# Patient Record
Sex: Female | Born: 1985 | Race: Black or African American | Hispanic: No | Marital: Single | Smoking: Current every day smoker
Health system: Southern US, Community
[De-identification: ages and names within clinical notes are randomized; demographics above are authoritative.]

## PROBLEM LIST (undated history)

## (undated) DIAGNOSIS — I10 Essential (primary) hypertension: Secondary | ICD-10-CM

## (undated) HISTORY — PX: ECTOPIC PREGNANCY SURGERY: SHX613

---

## 2007-12-11 ENCOUNTER — Emergency Department: Payer: Self-pay | Admitting: Internal Medicine

## 2009-07-07 ENCOUNTER — Emergency Department: Payer: Self-pay | Admitting: Emergency Medicine

## 2009-07-13 ENCOUNTER — Emergency Department: Payer: Self-pay | Admitting: Unknown Physician Specialty

## 2009-07-15 ENCOUNTER — Emergency Department: Payer: Self-pay | Admitting: Emergency Medicine

## 2010-08-27 ENCOUNTER — Emergency Department: Payer: Self-pay | Admitting: Emergency Medicine

## 2010-08-29 ENCOUNTER — Emergency Department: Payer: Self-pay | Admitting: Emergency Medicine

## 2010-08-31 ENCOUNTER — Emergency Department: Payer: Self-pay | Admitting: Emergency Medicine

## 2010-09-02 ENCOUNTER — Emergency Department: Payer: Self-pay | Admitting: Unknown Physician Specialty

## 2010-10-21 ENCOUNTER — Emergency Department: Payer: Self-pay | Admitting: Emergency Medicine

## 2010-10-24 ENCOUNTER — Emergency Department: Payer: Self-pay | Admitting: Emergency Medicine

## 2011-05-20 ENCOUNTER — Emergency Department: Payer: Self-pay | Admitting: *Deleted

## 2011-05-25 ENCOUNTER — Emergency Department: Payer: Self-pay | Admitting: *Deleted

## 2012-12-08 ENCOUNTER — Emergency Department: Payer: Self-pay | Admitting: Internal Medicine

## 2012-12-10 ENCOUNTER — Emergency Department: Payer: Self-pay | Admitting: Emergency Medicine

## 2012-12-12 ENCOUNTER — Emergency Department: Payer: Self-pay | Admitting: Emergency Medicine

## 2013-04-23 ENCOUNTER — Emergency Department: Payer: Self-pay | Admitting: Emergency Medicine

## 2013-06-02 ENCOUNTER — Emergency Department: Payer: Self-pay | Admitting: Emergency Medicine

## 2013-06-04 ENCOUNTER — Emergency Department: Payer: Self-pay | Admitting: Emergency Medicine

## 2013-09-30 ENCOUNTER — Emergency Department: Payer: Self-pay | Admitting: Emergency Medicine

## 2013-10-01 LAB — BETA STREP CULTURE(ARMC)

## 2014-05-02 ENCOUNTER — Emergency Department: Payer: Self-pay | Admitting: Internal Medicine

## 2014-06-19 ENCOUNTER — Emergency Department: Payer: Self-pay | Admitting: Student

## 2014-06-19 LAB — CBC WITH DIFFERENTIAL/PLATELET
Basophil #: 0 10*3/uL (ref 0.0–0.1)
Basophil %: 0.6 %
EOS ABS: 0.1 10*3/uL (ref 0.0–0.7)
EOS PCT: 0.9 %
HCT: 38.7 % (ref 35.0–47.0)
HGB: 12.5 g/dL (ref 12.0–16.0)
Lymphocyte #: 1.4 10*3/uL (ref 1.0–3.6)
Lymphocyte %: 17.4 %
MCH: 29.6 pg (ref 26.0–34.0)
MCHC: 32.3 g/dL (ref 32.0–36.0)
MCV: 92 fL (ref 80–100)
MONO ABS: 0.5 x10 3/mm (ref 0.2–0.9)
MONOS PCT: 6.1 %
NEUTROS ABS: 5.9 10*3/uL (ref 1.4–6.5)
Neutrophil %: 75 %
Platelet: 273 10*3/uL (ref 150–440)
RBC: 4.23 10*6/uL (ref 3.80–5.20)
RDW: 14.8 % — ABNORMAL HIGH (ref 11.5–14.5)
WBC: 7.9 10*3/uL (ref 3.6–11.0)

## 2014-06-19 LAB — URINALYSIS, COMPLETE
Bacteria: NONE SEEN
Bilirubin,UR: NEGATIVE
GLUCOSE, UR: NEGATIVE mg/dL (ref 0–75)
Ketone: NEGATIVE
Leukocyte Esterase: NEGATIVE
Nitrite: NEGATIVE
Ph: 6 (ref 4.5–8.0)
Protein: NEGATIVE
Specific Gravity: 1.024 (ref 1.003–1.030)
WBC UR: <1 /HPF (ref 0–5)

## 2014-06-19 LAB — COMPREHENSIVE METABOLIC PANEL
ALBUMIN: 3.7 g/dL (ref 3.4–5.0)
ALK PHOS: 79 U/L
Anion Gap: 4 — ABNORMAL LOW (ref 7–16)
BUN: 6 mg/dL — AB (ref 7–18)
Bilirubin,Total: 0.4 mg/dL (ref 0.2–1.0)
CO2: 26 mmol/L (ref 21–32)
CREATININE: 0.8 mg/dL (ref 0.60–1.30)
Calcium, Total: 9.8 mg/dL (ref 8.5–10.1)
Chloride: 110 mmol/L — ABNORMAL HIGH (ref 98–107)
EGFR (African American): >60
EGFR (Non-African Amer.): >60
GLUCOSE: 106 mg/dL — AB (ref 65–99)
Osmolality: 277 (ref 275–301)
POTASSIUM: 4.1 mmol/L (ref 3.5–5.1)
SGOT(AST): 14 U/L — ABNORMAL LOW (ref 15–37)
SGPT (ALT): 21 U/L
SODIUM: 140 mmol/L (ref 136–145)
Total Protein: 7.8 g/dL (ref 6.4–8.2)

## 2014-06-19 LAB — LIPASE, BLOOD: LIPASE: 140 U/L (ref 73–393)

## 2014-09-03 ENCOUNTER — Emergency Department: Payer: Self-pay | Admitting: Emergency Medicine

## 2014-09-03 LAB — URINALYSIS, COMPLETE
BACTERIA: NONE SEEN
Bilirubin,UR: NEGATIVE
GLUCOSE, UR: NEGATIVE mg/dL (ref 0–75)
Ketone: NEGATIVE
Leukocyte Esterase: NEGATIVE
Nitrite: NEGATIVE
Ph: 7 (ref 4.5–8.0)
Protein: NEGATIVE
RBC,UR: NONE SEEN /HPF (ref 0–5)
SPECIFIC GRAVITY: 1.004 (ref 1.003–1.030)
Squamous Epithelial: <1
WBC UR: NONE SEEN /HPF (ref 0–5)

## 2014-09-03 LAB — CBC WITH DIFFERENTIAL/PLATELET
BASOS PCT: 0.8 %
Basophil #: 0.1 10*3/uL (ref 0.0–0.1)
EOS ABS: 0.1 10*3/uL (ref 0.0–0.7)
Eosinophil %: 0.9 %
HCT: 38.7 % (ref 35.0–47.0)
HGB: 12.6 g/dL (ref 12.0–16.0)
Lymphocyte #: 1.3 10*3/uL (ref 1.0–3.6)
Lymphocyte %: 14.9 %
MCH: 29.4 pg (ref 26.0–34.0)
MCHC: 32.7 g/dL (ref 32.0–36.0)
MCV: 90 fL (ref 80–100)
MONOS PCT: 5.4 %
Monocyte #: 0.5 x10 3/mm (ref 0.2–0.9)
NEUTROS ABS: 7.1 10*3/uL — AB (ref 1.4–6.5)
Neutrophil %: 78 %
PLATELETS: 254 10*3/uL (ref 150–440)
RBC: 4.3 10*6/uL (ref 3.80–5.20)
RDW: 15 % — ABNORMAL HIGH (ref 11.5–14.5)
WBC: 9 10*3/uL (ref 3.6–11.0)

## 2014-09-03 LAB — COMPREHENSIVE METABOLIC PANEL
ALK PHOS: 96 U/L
Albumin: 3.8 g/dL (ref 3.4–5.0)
Anion Gap: 6 — ABNORMAL LOW (ref 7–16)
BUN: 6 mg/dL — ABNORMAL LOW (ref 7–18)
Bilirubin,Total: 0.3 mg/dL (ref 0.2–1.0)
Calcium, Total: 9.6 mg/dL (ref 8.5–10.1)
Chloride: 107 mmol/L (ref 98–107)
Co2: 27 mmol/L (ref 21–32)
Creatinine: 0.77 mg/dL (ref 0.60–1.30)
EGFR (African American): >60
EGFR (Non-African Amer.): >60
Glucose: 104 mg/dL — ABNORMAL HIGH (ref 65–99)
OSMOLALITY: 277 (ref 275–301)
Potassium: 4.2 mmol/L (ref 3.5–5.1)
SGOT(AST): 39 U/L — ABNORMAL HIGH (ref 15–37)
SGPT (ALT): 40 U/L
SODIUM: 140 mmol/L (ref 136–145)
TOTAL PROTEIN: 8.3 g/dL — AB (ref 6.4–8.2)

## 2014-09-03 LAB — LIPASE, BLOOD: Lipase: 138 U/L (ref 73–393)

## 2014-10-18 ENCOUNTER — Ambulatory Visit: Payer: Self-pay | Admitting: Obstetrics and Gynecology

## 2014-10-18 LAB — URINALYSIS, COMPLETE
Bacteria: NONE SEEN
Bilirubin,UR: NEGATIVE
Blood: NEGATIVE
Glucose,UR: NEGATIVE mg/dL (ref 0–75)
Ketone: NEGATIVE
Leukocyte Esterase: NEGATIVE
NITRITE: NEGATIVE
Ph: 7 (ref 4.5–8.0)
Protein: NEGATIVE
Specific Gravity: 1.005 (ref 1.003–1.030)
Squamous Epithelial: <1
WBC UR: NONE SEEN /HPF (ref 0–5)

## 2014-10-18 LAB — COMPREHENSIVE METABOLIC PANEL
ALT: 29 U/L
ANION GAP: 5 — AB (ref 7–16)
AST: 27 U/L (ref 15–37)
Albumin: 3.9 g/dL (ref 3.4–5.0)
Alkaline Phosphatase: 89 U/L
BILIRUBIN TOTAL: 0.4 mg/dL (ref 0.2–1.0)
BUN: 5 mg/dL — AB (ref 7–18)
CREATININE: 0.69 mg/dL (ref 0.60–1.30)
Calcium, Total: 9.8 mg/dL (ref 8.5–10.1)
Chloride: 106 mmol/L (ref 98–107)
Co2: 27 mmol/L (ref 21–32)
EGFR (Non-African Amer.): >60
Glucose: 110 mg/dL — ABNORMAL HIGH (ref 65–99)
Osmolality: 274 (ref 275–301)
Potassium: 4.2 mmol/L (ref 3.5–5.1)
Sodium: 138 mmol/L (ref 136–145)
Total Protein: 8.5 g/dL — ABNORMAL HIGH (ref 6.4–8.2)

## 2014-10-18 LAB — CBC WITH DIFFERENTIAL/PLATELET
BASOS PCT: 0.7 %
Basophil #: 0 10*3/uL (ref 0.0–0.1)
EOS PCT: 0.9 %
Eosinophil #: 0.1 10*3/uL (ref 0.0–0.7)
HCT: 38 % (ref 35.0–47.0)
HGB: 12.3 g/dL (ref 12.0–16.0)
LYMPHS PCT: 24.3 %
Lymphocyte #: 1.6 10*3/uL (ref 1.0–3.6)
MCH: 28.9 pg (ref 26.0–34.0)
MCHC: 32.5 g/dL (ref 32.0–36.0)
MCV: 89 fL (ref 80–100)
Monocyte #: 0.3 x10 3/mm (ref 0.2–0.9)
Monocyte %: 4.8 %
Neutrophil #: 4.4 10*3/uL (ref 1.4–6.5)
Neutrophil %: 69.3 %
Platelet: 270 10*3/uL (ref 150–440)
RBC: 4.27 10*6/uL (ref 3.80–5.20)
RDW: 15.8 % — ABNORMAL HIGH (ref 11.5–14.5)
WBC: 6.4 10*3/uL (ref 3.6–11.0)

## 2014-10-18 LAB — WET PREP, GENITAL

## 2014-10-18 LAB — GC/CHLAMYDIA PROBE AMP

## 2014-10-18 LAB — HCG, QUANTITATIVE, PREGNANCY: Beta Hcg, Quant.: 8282 m[IU]/mL — ABNORMAL HIGH

## 2014-11-01 DIAGNOSIS — Z8759 Personal history of other complications of pregnancy, childbirth and the puerperium: Secondary | ICD-10-CM | POA: Insufficient documentation

## 2014-11-15 ENCOUNTER — Emergency Department: Payer: Self-pay | Admitting: Emergency Medicine

## 2015-01-28 LAB — SURGICAL PATHOLOGY

## 2015-02-03 NOTE — Op Note (Signed)
PATIENT NAME:  Sue Hall, Sue Hall MR#:  [id number] DATE OF BIRTH:  October 27, 1985  DATE OF PROCEDURE:  10/18/2014  PREOPERATIVE DIAGNOSIS: Right tubal ectopic pregnancy.  POSTOPERATIVE DIAGNOSES: 1.  Right tubal ectopic pregnancy.  2.  Pelvic adhesive disease.  3.  Evidence of Fitz-Hugh-Curtis syndrome.   PROCEDURES:  1.  Laparoscopic lysis of adhesions.  2.  Right salpingectomy.   ANESTHESIA: General endotracheal anesthesia.   SURGEON: Boykin Nearing, MD   INDICATION: A 29 year old gravida 1, para 0. Patient presented to the Emergency Department with right lower quadrant pain. The patient had a quantitative hCG of 8200 and an ultrasound that showed a tubal ectopic pregnancy with positive fetal heart motion.   FINDINGS: Significant pelvic adhesions with adhesions of the anterior cul-de-sac, adhesions of the ovaries bilaterally to the ovarian fossae, and adhesions from the bilateral fallopian tubes to the ovaries. There was evidence of perihepatic stranding consistent with Fitz-Hugh-Curtis syndrome.   PROCEDURE: After adequate general endotracheal anesthesia, the patient was placed in the dorsal supine position. The patient's legs were placed in the [place]. Abdominal, perineal, and vaginal prep was performed. Foley catheter was placed, yielding 100 mL of clear urine. Speculum was placed and the anterior cervix was grasped with a single-tooth tenaculum and a Kahn cannula was placed into the endocervical canal to be used for uterine manipulation during the procedure. Gloves were changed and a 12 mm infraumbilical incision was made after injecting with 0.5% Marcaine. The laparoscope was advanced into the abdominal cavity under direct visualization with the Optiview cannula. The patient's abdomen was insufflated with carbon dioxide. A second port was placed, left lower quadrant, approximately 2 cm medial to the left anterior iliac spine under direct visualization. A #11 trocar was  advanced into the abdominal cavity. A third port was placed, right lower quadrant; again, 2 cm medial to the right anterior iliac spine, and a 5 mm port was advanced under direct visualization.  Initial impression as above: The patient with significant pelvic adhesions due to prior infection. Anterior cul-de-sac with stranding. Stranding from the ovary to the uterus and to the right fallopian tube to the ovary as well. These adhesions were taken down with the Harmonic scalpel. A 3 cm proximal ectopic pregnancy seen in the right fallopian tube. Distal portion of fallopian tube was grasped and the Harmonic scalpel was used to clamp and transect the right mesosalpinx and the fallopian tube with the ectopic pregnancy was removed as  1 unit. Good hemostasis was noted. Several additional adhesions on the patient's left side from the ovary to the medial side of the uterus were taken down with the Harmonic scalpel. The distal portion of the left fallopian tube was free and juxtaposed to the left ovary. The ureters were identified bilaterally with normal peristaltic activity. Again, upper abdomen showed some perihepatic stranding consistent with old infection. Pressure was lowered to 7 mmHg and good hemostasis was noted from the dissected area. All instruments were removed. The patient's abdomen was deflated. The left lower quadrant incision was closed with a 2-0 Vicryl fascial layer as well as an infraumbilical fascial closure with 2-0 Vicryl, and all incisions were closed with 4-0 interrupted Vicryl. Dermabond placed and Tegaderm placed. Foley was removed as well as the single-tooth tenaculum and the Kahn cannula. Good hemostasis was noted. There were no complications.  ESTIMATED BLOOD LOSS: Minimal.  INTRAOPERATIVE FLUIDS: 1000 mL.   URINE OUTPUT: 125 mL  DISPOSITION: The patient was taken to the recovery room in good  condition.   ____________________________ Boykin Nearing, MD tjs:ST D: 10/18/2014  22:22:30 ET T: 10/19/2014 00:16:15 ET JOB#: [id number]  cc: Boykin Nearing, MD, <Dictator> Boykin Nearing MD ELECTRONICALLY SIGNED 10/22/2014 9:21

## 2015-02-03 NOTE — Consult Note (Signed)
PATIENT NAME:  Sue Hall, Sue Hall MR#:  [id number] DATE OF BIRTH:  1986/06/04  DATE OF CONSULTATION:  10/18/2014  REFERRING PHYSICIAN:   CONSULTING PHYSICIAN:  Boykin Nearing, MD  REQUESTING PHYSICIAN:  Larae Grooms, MD.    CONSULTANT:  Laverta Baltimore, MD,   HISTORY OF PRESENT ILLNESS: A 29 year old gravida 1, para 0, last menstrual period 09/22/2014, the patient found out she was pregnant the day of presenting to the Emergency Department. The patient has had right lower quadrant pain worsening today. She denies any vaginal bleeding. The patient's quantitative beta-hCG 8282. She had a pelvic ultrasound on today that showed an empty uterus and right tubal mass with a gestational sac and fetal pole with fetal heart rate. The patient last ate at 1300.   PAST MEDICAL HISTORY:  Left Bartholin gland abscess.   PAST SURGICAL HISTORY: Bartholin gland abscess drainage.   REVIEW OF SYSTEMS: Unremarkable.   ALLERGIES: No known drug allergies.   SOCIAL HISTORY: Smokes. No alcohol.   MEDICATIONS: None.    PHYSICAL EXAMINATION:  GENERAL: Well-developed, well-nourished black female, in no acute distress.  VITAL SIGNS: Blood pressure 164/81, pulse 107, and temperature 99.2.  LUNGS: Clear to auscultation.  CARDIOVASCULAR: Regular rate and rhythm.  ABDOMEN: Soft, nontender. No rebound tenderness.  PELVIC: Deferred.   ASSESSMENT: Right tubal ectopic pregnancy.   PLAN: I spoke to the patient regarding the significance of this and recommend a laparoscopic salpingostomy versus salpingectomy tonight. The patient is aware of the potential risks from surgery including organ injury to bowel, bladder, ureters, risk of blood transfusion, risk of hepatitis and HIV if blood is given. The patient agrees to the procedure. All questions have been answered.    ____________________________ Boykin Nearing, MD tjs:bu D: 10/18/2014 18:10:30 ET T: 10/18/2014 18:15:39  ET JOB#: [id number]  cc: Boykin Nearing, MD, <Dictator> Boykin Nearing MD ELECTRONICALLY SIGNED 10/22/2014 9:21

## 2015-04-01 ENCOUNTER — Emergency Department
Admission: EM | Admit: 2015-04-01 | Discharge: 2015-04-01 | Disposition: A | Discharge status: Home / Self Care | Payer: Self-pay | Attending: Student | Admitting: Student

## 2015-04-01 ENCOUNTER — Encounter: Payer: Self-pay | Admitting: *Deleted

## 2015-04-01 DIAGNOSIS — N760 Acute vaginitis: Secondary | ICD-10-CM | POA: Insufficient documentation

## 2015-04-01 DIAGNOSIS — Z720 Tobacco use: Secondary | ICD-10-CM | POA: Insufficient documentation

## 2015-04-01 DIAGNOSIS — Z3202 Encounter for pregnancy test, result negative: Secondary | ICD-10-CM | POA: Insufficient documentation

## 2015-04-01 DIAGNOSIS — B9689 Other specified bacterial agents as the cause of diseases classified elsewhere: Secondary | ICD-10-CM

## 2015-04-01 LAB — CBC WITH DIFFERENTIAL/PLATELET
Basophils Absolute: 0.1 10*3/uL (ref 0–0.1)
Basophils Relative: 1 %
Eosinophils Absolute: 0.2 10*3/uL (ref 0–0.7)
Eosinophils Relative: 2 %
HCT: 36.5 % (ref 35.0–47.0)
Hemoglobin: 12 g/dL (ref 12.0–16.0)
Lymphocytes Relative: 33 %
Lymphs Abs: 2.6 10*3/uL (ref 1.0–3.6)
MCH: 28.1 pg (ref 26.0–34.0)
MCHC: 32.9 g/dL (ref 32.0–36.0)
MCV: 85.5 fL (ref 80.0–100.0)
Monocytes Absolute: 0.5 10*3/uL (ref 0.2–0.9)
Monocytes Relative: 6 %
Neutro Abs: 4.5 10*3/uL (ref 1.4–6.5)
Neutrophils Relative %: 58 %
Platelets: 254 10*3/uL (ref 150–440)
RBC: 4.27 MIL/uL (ref 3.80–5.20)
RDW: 17.8 % — ABNORMAL HIGH (ref 11.5–14.5)
WBC: 7.8 10*3/uL (ref 3.6–11.0)

## 2015-04-01 LAB — COMPREHENSIVE METABOLIC PANEL
ALBUMIN: 4.1 g/dL (ref 3.5–5.0)
ALK PHOS: 73 U/L (ref 38–126)
ALT: 16 U/L (ref 14–54)
ANION GAP: 6 (ref 5–15)
AST: 25 U/L (ref 15–41)
BUN: 8 mg/dL (ref 6–20)
CALCIUM: 10.2 mg/dL (ref 8.9–10.3)
CO2: 27 mmol/L (ref 22–32)
CREATININE: 0.7 mg/dL (ref 0.44–1.00)
Chloride: 107 mmol/L (ref 101–111)
GFR calc Af Amer: >60 mL/min (ref >60)
GFR calc non Af Amer: >60 mL/min (ref >60)
Glucose, Bld: 89 mg/dL (ref 65–99)
POTASSIUM: 3.6 mmol/L (ref 3.5–5.1)
Sodium: 140 mmol/L (ref 135–145)
TOTAL PROTEIN: 8 g/dL (ref 6.5–8.1)
Total Bilirubin: 1 mg/dL (ref 0.3–1.2)

## 2015-04-01 LAB — LIPASE, BLOOD: Lipase: 31 U/L (ref 22–51)

## 2015-04-01 LAB — URINALYSIS COMPLETE WITH MICROSCOPIC (ARMC ONLY)
Bacteria, UA: NONE SEEN
Bilirubin Urine: NEGATIVE
Glucose, UA: NEGATIVE mg/dL
Ketones, ur: NEGATIVE mg/dL
Nitrite: NEGATIVE
Protein, ur: NEGATIVE mg/dL
Specific Gravity, Urine: 1.02 (ref 1.005–1.030)
pH: 6 (ref 5.0–8.0)

## 2015-04-01 LAB — CHLAMYDIA/NGC RT PCR (ARMC ONLY)
Chlamydia Tr: NOT DETECTED
N gonorrhoeae: NOT DETECTED

## 2015-04-01 LAB — WET PREP, GENITAL
Trich, Wet Prep: NONE SEEN
Yeast Wet Prep HPF POC: NONE SEEN

## 2015-04-01 LAB — PREGNANCY, URINE: Preg Test, Ur: NEGATIVE

## 2015-04-01 MED ORDER — METRONIDAZOLE 250 MG PO TABS
500.0000 mg | ORAL_TABLET | Freq: Once | ORAL | Status: AC
  Administered 2015-04-01: 500 mg via ORAL

## 2015-04-01 MED ORDER — METRONIDAZOLE 250 MG PO TABS
ORAL_TABLET | ORAL | Status: AC
  Administered 2015-04-01: 500 mg via ORAL
  Filled 2015-04-01: qty 2

## 2015-04-01 MED ORDER — METRONIDAZOLE 500 MG PO TABS: 500.0000 mg | ORAL_TABLET | Freq: Two times a day (BID) | ORAL | Status: AC

## 2015-04-01 MED ORDER — IBUPROFEN 800 MG PO TABS: 800.0000 mg | ORAL_TABLET | Freq: Three times a day (TID) | ORAL | Status: DC | PRN

## 2015-04-01 NOTE — ED Notes (Signed)
Past few days c/o low abd pain, some vag discharge

## 2015-04-01 NOTE — Discharge Instructions (Signed)
Bacterial Vaginosis Bacterial vaginosis is a vaginal infection that occurs when the normal balance of bacteria in the vagina is disrupted. It results from an overgrowth of certain bacteria. This is the most common vaginal infection in women of childbearing age. Treatment is important to prevent complications, especially in pregnant women, as it can cause a premature delivery. CAUSES  Bacterial vaginosis is caused by an increase in harmful bacteria that are normally present in smaller amounts in the vagina. Several different kinds of bacteria can cause bacterial vaginosis. However, the reason that the condition develops is not fully understood. RISK FACTORS Certain activities or behaviors can put you at an increased risk of developing bacterial vaginosis, including:  Having a new sex partner or multiple sex partners.  Douching.  Using an intrauterine device (IUD) for contraception. Women do not get bacterial vaginosis from toilet seats, bedding, swimming pools, or contact with objects around them. SIGNS AND SYMPTOMS  Some women with bacterial vaginosis have no signs or symptoms. Common symptoms include:  Grey vaginal discharge.  A fishlike odor with discharge, especially after sexual intercourse.  Itching or burning of the vagina and vulva.  Burning or pain with urination. DIAGNOSIS  Your health care provider will take a medical history and examine the vagina for signs of bacterial vaginosis. A sample of vaginal fluid may be taken. Your health care provider will look at this sample under a microscope to check for bacteria and abnormal cells. A vaginal pH test may also be done.  TREATMENT  Bacterial vaginosis may be treated with antibiotic medicines. These may be given in the form of a pill or a vaginal cream. A second round of antibiotics may be prescribed if the condition comes back after treatment.  HOME CARE INSTRUCTIONS   Only take over-the-counter or prescription medicines as  directed by your health care provider.  If antibiotic medicine was prescribed, take it as directed. Make sure you finish it even if you start to feel better.  Do not have sex until treatment is completed.  Tell all sexual partners that you have a vaginal infection. They should see their health care provider and be treated if they have problems, such as a mild rash or itching.  Practice safe sex by using condoms and only having one sex partner. SEEK MEDICAL CARE IF:   Your symptoms are not improving after 3 days of treatment.  You have increased discharge or pain.  You have a fever. MAKE SURE YOU:   Understand these instructions.  Will watch your condition.  Will get help right away if you are not doing well or get worse. FOR MORE INFORMATION  Centers for Disease Control and Prevention, Division of STD Prevention: www.cdc.gov/std American Sexual Health Association (ASHA): www.ashastd.org  Document Released: 09/21/2005 Document Revised: 07/12/2013 Document Reviewed: 05/03/2013 ExitCare Patient Information 2015 [employer]. This information is not intended to replace advice given to you by your health care provider. Make sure you discuss any questions you have with your health care provider.  

## 2015-04-01 NOTE — ED Provider Notes (Signed)
[facility] Emergency Department Provider Note  ____________________________________________  Time seen: 0743  I have reviewed the triage vital signs and the nursing notes.   HISTORY  Chief Complaint Abdominal Pain    HPI Sue Hall is a 29 y.o. female arrives here today with lower abdominal pain and vaginal discharge states that for the last few days she's noticed a light discharge lower abdominal pain denies any bleeding denies any bowel complaints a sexually active does not use birth control and is here today for further evaluation and treatments there is no other complaints at this time rates her overall pain and discomfort is approximately a8 out of 10 nothing making it particularly better or worse   History reviewed. No pertinent past medical history.  There are no active problems to display for this patient.   Past Surgical History  Procedure Laterality Date  . Ectopic pregnancy surgery Right     Current Outpatient Rx  Name  Route  Sig  Dispense  Refill  . ibuprofen (ADVIL,MOTRIN) 800 MG tablet   Oral   Take 1 tablet (800 mg total) by mouth every 8 (eight) hours as needed.   30 tablet   0   . metroNIDAZOLE (FLAGYL) 500 MG tablet   Oral   Take 1 tablet (500 mg total) by mouth 2 (two) times daily.   20 tablet   0     Allergies Review of patient's allergies indicates no known allergies.  No family history on file.  Social History History  Substance Use Topics  . Smoking status: Current Every Day Smoker  . Smokeless tobacco: Not on file  . Alcohol Use: No    Review of Systems Constitutional: No fever/chills Eyes: No visual changes. ENT: No sore throat. Cardiovascular: Denies chest pain. Respiratory: Denies shortness of breath. Gastrointestinal: No abdominal pain.  No nausea, no vomiting.  No diarrhea.  No constipation. Genitourinary: Negative for dysuria. Musculoskeletal: Negative for back pain. Skin: Negative for  rash. Neurological: Negative for headaches, focal weakness or numbness.  10-point ROS otherwise negative.  ____________________________________________   PHYSICAL EXAM:  VITAL SIGNS: ED Triage Vitals  Enc Vitals Group     BP 04/01/15 0709 138/89 mmHg     Pulse Rate 04/01/15 0709 94     Resp 04/01/15 0709 18     Temp 04/01/15 0709 98 F (36.7 C)     Temp Source 04/01/15 0709 Oral     SpO2 04/01/15 0709 10 %     Weight 04/01/15 0709 165 lb (74.844 kg)     Height 04/01/15 0709 5\' 3"  (1.6 m)     Head Cir --      Peak Flow --      Pain Score 04/01/15 0709 8     Pain Loc --      Pain Edu? --      Excl. in [place]? --     Constitutional: Alert and oriented. Well appearing and in no acute distress. Eyes: Conjunctivae are normal. PERRL. EOMI. Head: Atraumatic. Nose: No congestion/rhinnorhea. Mouth/Throat: Mucous membranes are moist.  Oropharynx non-erythematous. Neck: No stridor.   Cardiovascular: Normal rate, regular rhythm. Grossly normal heart sounds.  Good peripheral circulation. Respiratory: Normal respiratory effort.  No retractions. Lungs CTAB. Gastrointestinal: Soft and nontender. No distention. No abdominal bruits. No CVA tenderness. Genitourinary: External genitalia unremarkable light gray vaginal discharge no cervical motion or adnexal tenderness Musculoskeletal: No lower extremity tenderness nor edema.  No joint effusions. Neurologic:  Normal speech and language.  No gross focal neurologic deficits are appreciated. Speech is normal. No gait instability. Skin:  Skin is warm, dry and intact. No rash noted. Psychiatric: Mood and affect are normal. Speech and behavior are normal.  ____________________________________________   LABS (all labs ordered are listed, but only abnormal results are displayed)  Labs Reviewed  WET PREP, GENITAL - Abnormal; Notable for the following:    Clue Cells Wet Prep HPF POC MODERATE (*)    WBC, Wet Prep HPF POC MODERATE (*)    All other  components within normal limits  URINALYSIS COMPLETEWITH MICROSCOPIC ([facility] ONLY) - Abnormal; Notable for the following:    Color, Urine YELLOW (*)    APPearance CLEAR (*)    Hgb urine dipstick 1+ (*)    Leukocytes, UA 2+ (*)    Squamous Epithelial / LPF 0-5 (*)    All other components within normal limits  CBC WITH DIFFERENTIAL/PLATELET - Abnormal; Notable for the following:    RDW 17.8 (*)    All other components within normal limits  CHLAMYDIA/NGC RT PCR ([facility] ONLY)  WET PREP, GENITAL  CHLAMYDIA/NGC RT PCR ([facility] ONLY)  COMPREHENSIVE METABOLIC PANEL  LIPASE, BLOOD  PREGNANCY, URINE      PROCEDURES  Procedure(s) performed: None  Critical Care performed: No  ____________________________________________   INITIAL IMPRESSION / ASSESSMENT AND PLAN / ED COURSE  Pertinent labs & imaging results that were available during my care of the patient were reviewed by me and considered in my medical decision making (see chart for details).  Initial impression on this patient has bacterial vaginitis states that she has had a little bit of lower abdominal pain and discomfort with a light vaginal discharge no bleeding no fever nausea vomiting or diarrhea no bowel complaints given this with the exam and lab findings to fill comfortable starting her on Flagyl Motrin discussed with her that a gonorrhea and chlamydia swabs were sent results will be back if positive she may be followed up with she should check for herself if she does not hear anything should follow-up with crit no clinic or return here for any acute concerns or worsening symptoms ____________________________________________   FINAL CLINICAL IMPRESSION(S) / ED DIAGNOSES  Final diagnoses:  Bacterial vaginitis      Loraine Freid Verdene Rio, PA-C 04/01/15 [place], MD 04/01/15 1527

## 2015-05-07 DIAGNOSIS — D259 Leiomyoma of uterus, unspecified: Secondary | ICD-10-CM | POA: Insufficient documentation

## 2015-05-07 LAB — HM PAP SMEAR: HM Pap smear: NEGATIVE

## 2016-03-06 ENCOUNTER — Emergency Department
Admission: EM | Admit: 2016-03-06 | Discharge: 2016-03-06 | Disposition: A | Discharge status: Home / Self Care | Payer: Self-pay | Attending: Student | Admitting: Student

## 2016-03-06 ENCOUNTER — Encounter: Payer: Self-pay | Admitting: Emergency Medicine

## 2016-03-06 DIAGNOSIS — R110 Nausea: Secondary | ICD-10-CM | POA: Insufficient documentation

## 2016-03-06 DIAGNOSIS — M5442 Lumbago with sciatica, left side: Secondary | ICD-10-CM | POA: Insufficient documentation

## 2016-03-06 DIAGNOSIS — I10 Essential (primary) hypertension: Secondary | ICD-10-CM | POA: Insufficient documentation

## 2016-03-06 DIAGNOSIS — F17210 Nicotine dependence, cigarettes, uncomplicated: Secondary | ICD-10-CM | POA: Insufficient documentation

## 2016-03-06 HISTORY — DX: Essential (primary) hypertension: I10

## 2016-03-06 LAB — URINALYSIS COMPLETE WITH MICROSCOPIC (ARMC ONLY)
Bilirubin Urine: NEGATIVE
GLUCOSE, UA: NEGATIVE mg/dL
HGB URINE DIPSTICK: NEGATIVE
Ketones, ur: NEGATIVE mg/dL
Leukocytes, UA: NEGATIVE
Nitrite: NEGATIVE
PROTEIN: NEGATIVE mg/dL
Specific Gravity, Urine: 1.008 (ref 1.005–1.030)
pH: 8 (ref 5.0–8.0)

## 2016-03-06 LAB — POCT PREGNANCY, URINE: Preg Test, Ur: NEGATIVE

## 2016-03-06 MED ORDER — NAPROXEN 500 MG PO TABS
500.0000 mg | ORAL_TABLET | Freq: Two times a day (BID) | ORAL | Status: AC
Start: 2016-03-06 — End: 2017-03-06

## 2016-03-06 MED ORDER — CYCLOBENZAPRINE HCL 5 MG PO TABS: 5.0000 mg | ORAL_TABLET | Freq: Three times a day (TID) | ORAL | Status: DC | PRN

## 2016-03-06 MED ORDER — POLYETHYLENE GLYCOL 3350 17 GM/SCOOP PO POWD: ORAL | Status: DC

## 2016-03-06 NOTE — ED Notes (Signed)
Pt presents to ED with c/o left flank pain x2 weeks with nausea. Last period 02/07/16.

## 2016-03-06 NOTE — ED Provider Notes (Signed)
[facility] Emergency Department Provider Note   ____________________________________________  Time seen: Approximately 7:00 AM  I have reviewed the triage vital signs and the nursing notes.   HISTORY  Chief Complaint Flank Pain    HPI Sue Hall is a 30 y.o. female with history of hypertension who presents for evaluation of 2 weeks of intermittent left lumbar back pain radiating into the left buttocks, gradual onset, improves with changing her position/shifting her weight when on her feet, currently moderate. She denies any trauma. She denies any abdominal pain. She has had intermittent nausea but no vomiting or diarrhea. She has chronic constipation, last bowel movement was yesterday. She continues to pass flatus. She denies any numbness or weakness in the legs, no saddle anesthesia, no loss of control of bowel or bladder/incontinence, no history of malignancy or IV drug use.No dysuria or hematuria.   Past Medical History  Diagnosis Date  . Hypertension     There are no active problems to display for this patient.   Past Surgical History  Procedure Laterality Date  . Ectopic pregnancy surgery Right     Current Outpatient Rx  Name  Route  Sig  Dispense  Refill  . cyclobenzaprine (FLEXERIL) 5 MG tablet   Oral   Take 1 tablet (5 mg total) by mouth 3 (three) times daily as needed for muscle spasms. Do not drive while taking this medication.   12 tablet   0   . ibuprofen (ADVIL,MOTRIN) 800 MG tablet   Oral   Take 1 tablet (800 mg total) by mouth every 8 (eight) hours as needed.   30 tablet   0   . naproxen (NAPROSYN) 500 MG tablet   Oral   Take 1 tablet (500 mg total) by mouth 2 (two) times daily with a meal. As needed for pain.   20 tablet   0   . polyethylene glycol powder (GLYCOLAX/MIRALAX) powder      Dissolve 1 tablespoon in 4-8 ounces of juice or water and drink once daily.   255 g   0     Allergies Review of  patient's allergies indicates no known allergies.  History reviewed. No pertinent family history.  Social History Social History  Substance Use Topics  . Smoking status: Current Every Day Smoker -- 0.50 packs/day    Types: Cigarettes  . Smokeless tobacco: None  . Alcohol Use: No    Review of Systems Constitutional: No fever/chills Eyes: No visual changes. ENT: No sore throat. Cardiovascular: Denies chest pain. Respiratory: Denies shortness of breath. Gastrointestinal: No abdominal pain.  + nausea, no vomiting.  No diarrhea.  No constipation. Genitourinary: Negative for dysuria. Musculoskeletal: Positive for back pain. Skin: Negative for rash. Neurological: Negative for headaches, focal weakness or numbness.  10-point ROS otherwise negative.  ____________________________________________   PHYSICAL EXAM:  VITAL SIGNS: ED Triage Vitals  Enc Vitals Group     BP 03/06/16 0622 149/94 mmHg     Pulse Rate 03/06/16 0622 94     Resp 03/06/16 0622 16     Temp 03/06/16 0622 98.1 F (36.7 C)     Temp Source 03/06/16 0622 Oral     SpO2 03/06/16 0622 100 %     Weight 03/06/16 0622 160 lb (72.576 kg)     Height 03/06/16 0622 5\' 3"  (1.6 m)     Head Cir --      Peak Flow --      Pain Score 03/06/16 0623 8  Pain Loc --      Pain Edu? --      Excl. in [place] --     Constitutional: Alert and oriented. Well appearing and in no acute distress. Sitting up in the bedside chair, texting on her Smart phone. Eyes: Conjunctivae are normal. PERRL. EOMI. Head: Atraumatic. Nose: No congestion/rhinnorhea. Mouth/Throat: Mucous membranes are moist.  Oropharynx non-erythematous. Neck: No stridor.  No cervical spine tenderness to palpation. Cardiovascular: Normal rate, regular rhythm. Grossly normal heart sounds.  Good peripheral circulation. Respiratory: Normal respiratory effort.  No retractions. Lungs CTAB. Gastrointestinal: Soft and nontender. No distention. No CVA  tenderness. Genitourinary: deferred Musculoskeletal: No lower extremity tenderness nor edema.  No joint effusions. Mild tenderness to palpation in the left paravertebral muscles associated with the lumbar spine, no midline tenderness. 5 strength of dorsiflexion of the big toes bilaterally. 2+ DP pulse bilaterally. Neurologic:  Normal speech and language. No gross focal neurologic deficits are appreciated. No gait instability. Positive straight leg raise at 45 in the left leg. Skin:  Skin is warm, dry and intact. No rash noted. Psychiatric: Mood and affect are normal. Speech and behavior are normal.  ____________________________________________   LABS (all labs ordered are listed, but only abnormal results are displayed)  Labs Reviewed  URINALYSIS COMPLETEWITH MICROSCOPIC ([place])  POCT PREGNANCY, URINE  POC URINE PREG, ED   ____________________________________________  EKG  none ____________________________________________  RADIOLOGY  none ____________________________________________   PROCEDURES  Procedure(s) performed: None  Critical Care performed: No  ____________________________________________   INITIAL IMPRESSION / ASSESSMENT AND PLAN / ED COURSE  Pertinent labs & imaging results that were available during my care of the patient were reviewed by me and considered in my medical decision making (see chart for details).  Sue Hall is a 30 y.o. female with history of hypertension who presents for evaluation of 2 weeks of intermittent left lumbar back pain radiating into the left buttocks. On exam, she is very well-appearing and in no acute distress. Vital signs stable, she is afebrile. Family is consistent with sciatica, she has a positive straight leg raise at 13 in the left leg. No red flags concerning for cauda equina or epidural abscess. Pregnancy test is negative. Urinalysis is not consistent with infection. We'll discharge with naproxen, Flexeril,  return precautions, close PCP follow-up, she is comfortable with the discharge plan. She is also requesting a medicine for help with chronic constipation, will DC with MiraLAX. ____________________________________________   FINAL CLINICAL IMPRESSION(S) / ED DIAGNOSES  Final diagnoses:  Left-sided low back pain with left-sided sciatica      NEW MEDICATIONS STARTED DURING THIS VISIT:  New Prescriptions   CYCLOBENZAPRINE (FLEXERIL) 5 MG TABLET    Take 1 tablet (5 mg total) by mouth 3 (three) times daily as needed for muscle spasms. Do not drive while taking this medication.   NAPROXEN (NAPROSYN) 500 MG TABLET    Take 1 tablet (500 mg total) by mouth 2 (two) times daily with a meal. As needed for pain.   POLYETHYLENE GLYCOL POWDER (GLYCOLAX/MIRALAX) POWDER    Dissolve 1 tablespoon in 4-8 ounces of juice or water and drink once daily.     Note:  This document was prepared using Dragon voice recognition software and may include unintentional dictation errors.    Joanne Gavel, MD 03/06/16 [phone]

## 2016-03-08 ENCOUNTER — Emergency Department
Admission: EM | Admit: 2016-03-08 | Discharge: 2016-03-09 | Disposition: A | Discharge status: Home / Self Care | Payer: Self-pay | Attending: Emergency Medicine | Admitting: Emergency Medicine

## 2016-03-08 ENCOUNTER — Encounter: Payer: Self-pay | Admitting: Emergency Medicine

## 2016-03-08 DIAGNOSIS — D259 Leiomyoma of uterus, unspecified: Secondary | ICD-10-CM | POA: Insufficient documentation

## 2016-03-08 DIAGNOSIS — R109 Unspecified abdominal pain: Secondary | ICD-10-CM

## 2016-03-08 DIAGNOSIS — N938 Other specified abnormal uterine and vaginal bleeding: Secondary | ICD-10-CM

## 2016-03-08 DIAGNOSIS — I10 Essential (primary) hypertension: Secondary | ICD-10-CM | POA: Insufficient documentation

## 2016-03-08 DIAGNOSIS — F17210 Nicotine dependence, cigarettes, uncomplicated: Secondary | ICD-10-CM | POA: Insufficient documentation

## 2016-03-08 LAB — COMPREHENSIVE METABOLIC PANEL
ALBUMIN: 4.1 g/dL (ref 3.5–5.0)
ALK PHOS: 90 U/L (ref 38–126)
ALT: 18 U/L (ref 14–54)
AST: 23 U/L (ref 15–41)
Anion gap: 5 (ref 5–15)
BILIRUBIN TOTAL: 0.4 mg/dL (ref 0.3–1.2)
BUN: 13 mg/dL (ref 6–20)
CALCIUM: 9.9 mg/dL (ref 8.9–10.3)
CO2: 21 mmol/L — AB (ref 22–32)
CREATININE: 0.7 mg/dL (ref 0.44–1.00)
Chloride: 111 mmol/L (ref 101–111)
GFR calc non Af Amer: >60 mL/min (ref >60)
Glucose, Bld: 110 mg/dL — ABNORMAL HIGH (ref 65–99)
Potassium: 3.9 mmol/L (ref 3.5–5.1)
SODIUM: 137 mmol/L (ref 135–145)
Total Protein: 7.9 g/dL (ref 6.5–8.1)

## 2016-03-08 LAB — CBC
HCT: 35 % (ref 35.0–47.0)
Hemoglobin: 11.4 g/dL — ABNORMAL LOW (ref 12.0–16.0)
MCH: 25.7 pg — AB (ref 26.0–34.0)
MCHC: 32.7 g/dL (ref 32.0–36.0)
MCV: 78.6 fL — ABNORMAL LOW (ref 80.0–100.0)
Platelets: 260 10*3/uL (ref 150–440)
RBC: 4.45 MIL/uL (ref 3.80–5.20)
RDW: 15.4 % — ABNORMAL HIGH (ref 11.5–14.5)
WBC: 7.1 10*3/uL (ref 3.6–11.0)

## 2016-03-08 LAB — POCT PREGNANCY, URINE: PREG TEST UR: NEGATIVE

## 2016-03-08 LAB — LIPASE, BLOOD: Lipase: 35 U/L (ref 11–51)

## 2016-03-08 NOTE — ED Notes (Addendum)
Pt seen here 2 days ago with back pain, diagnosed with Sciatica; returns tonight with low midline abd pain and irregular menses (this has been for about a year); pt diagnosed with fibroids over a year ago; pt has not seen a provider since as she has no insurance; pt has not yet filled medications prescribed to her last visit

## 2016-03-09 ENCOUNTER — Emergency Department: Payer: Self-pay

## 2016-03-09 LAB — URINALYSIS COMPLETE WITH MICROSCOPIC (ARMC ONLY)
BILIRUBIN URINE: NEGATIVE
Bacteria, UA: NONE SEEN
Glucose, UA: NEGATIVE mg/dL
KETONES UR: NEGATIVE mg/dL
Leukocytes, UA: NEGATIVE
Nitrite: NEGATIVE
Protein, ur: NEGATIVE mg/dL
Specific Gravity, Urine: 1.024 (ref 1.005–1.030)
pH: 5 (ref 5.0–8.0)

## 2016-03-09 LAB — WET PREP, GENITAL
CLUE CELLS WET PREP: NONE SEEN
SPERM: NONE SEEN
TRICH WET PREP: NONE SEEN
YEAST WET PREP: NONE SEEN

## 2016-03-09 LAB — CHLAMYDIA/NGC RT PCR (ARMC ONLY)
Chlamydia Tr: NOT DETECTED
N GONORRHOEAE: NOT DETECTED

## 2016-03-09 MED ORDER — IBUPROFEN 600 MG PO TABS
600.0000 mg | ORAL_TABLET | Freq: Once | ORAL | Status: AC
  Administered 2016-03-09: 600 mg via ORAL
  Filled 2016-03-09: qty 1

## 2016-03-09 NOTE — ED Provider Notes (Signed)
[facility] Emergency Department Provider Note   ____________________________________________  Time seen: Approximately 2351 PM  I have reviewed the triage vital signs and the nursing notes.   HISTORY  Chief Complaint Abdominal Pain and Vaginal Bleeding    HPI Sue Hall is a 30 y.o. female who comes into the hospital today with some abdominal pain and vaginal bleeding. She reports that she's been passing clots. The patient started her menstrual cycle yesterday and has pain in the lower abdomen. She reports this feels worse than her typical cramps. She is having twisting in her belly. She has not taken anything for pain she has just been sleeping. The patient was diagnosed with fibroids last year but has not seen an OB/GYN since then. She reports that she wears tampons and has to change them every hour. She denies any dizziness or lightheadedness but rates her pain a 9 out of 10 in intensity. The patient was unsure what was going on so she decided to come into the hospital to be evaluated.   Past Medical History  Diagnosis Date  . Hypertension     There are no active problems to display for this patient.   Past Surgical History  Procedure Laterality Date  . Ectopic pregnancy surgery Right     Current Outpatient Rx  Name  Route  Sig  Dispense  Refill  . cyclobenzaprine (FLEXERIL) 5 MG tablet   Oral   Take 1 tablet (5 mg total) by mouth 3 (three) times daily as needed for muscle spasms. Do not drive while taking this medication.   12 tablet   0   . ibuprofen (ADVIL,MOTRIN) 800 MG tablet   Oral   Take 1 tablet (800 mg total) by mouth every 8 (eight) hours as needed.   30 tablet   0   . naproxen (NAPROSYN) 500 MG tablet   Oral   Take 1 tablet (500 mg total) by mouth 2 (two) times daily with a meal. As needed for pain.   20 tablet   0   . polyethylene glycol powder (GLYCOLAX/MIRALAX) powder      Dissolve 1 tablespoon in 4-8 ounces  of juice or water and drink once daily.   255 g   0     Allergies Review of patient's allergies indicates no known allergies.  History reviewed. No pertinent family history.  Social History Social History  Substance Use Topics  . Smoking status: Current Every Day Smoker -- 0.50 packs/day    Types: Cigarettes  . Smokeless tobacco: None  . Alcohol Use: No    Review of Systems Constitutional: No fever/chills Eyes: No visual changes. ENT: No sore throat. Cardiovascular: Denies chest pain. Respiratory: Denies shortness of breath. Gastrointestinal:  abdominal pain.  No nausea, no vomiting.  No diarrhea.  No constipation. Genitourinary:Vaginal bleeding Musculoskeletal: Negative for back pain. Skin: Negative for rash. Neurological: Negative for headaches, focal weakness or numbness.  10-point ROS otherwise negative.  ____________________________________________   PHYSICAL EXAM:  VITAL SIGNS: ED Triage Vitals  Enc Vitals Group     BP 03/08/16 2148 132/81 mmHg     Pulse Rate 03/08/16 2148 82     Resp 03/08/16 2148 18     Temp 03/08/16 2148 98.1 F (36.7 C)     Temp Source 03/08/16 2148 Oral     SpO2 03/08/16 2148 100 %     Weight 03/08/16 2148 160 lb (72.576 kg)     Height 03/08/16 2148 5\' 3"  (1.6  m)     Head Cir --      Peak Flow --      Pain Score 03/08/16 2154 10     Pain Loc --      Pain Edu? --      Excl. in [place]? --     Constitutional: Alert and oriented. Well appearing and in Mild to moderate distress. Eyes: Conjunctivae are normal. PERRL. EOMI. Head: Atraumatic. Nose: No congestion/rhinnorhea. Mouth/Throat: Mucous membranes are moist.  Oropharynx non-erythematous. Cardiovascular: Normal rate, regular rhythm. Grossly normal heart sounds.  Good peripheral circulation. Respiratory: Normal respiratory effort.  No retractions. Lungs CTAB. Gastrointestinal: Soft with mild lower abdominal tenderness to palpation. No distention. Positive bowel  sounds. Genitourinary: Normal external genitalia, minimal blood in the vaginal vault cervix unremarkable in appearance, no cervical motion tenderness, no adnexal tenderness, no uterine tenderness to palpation. Musculoskeletal: No lower extremity tenderness nor edema.   Neurologic:  Normal speech and language.  Skin:  Skin is warm, dry and intact.  Psychiatric: Mood and affect are normal.   ____________________________________________   LABS (all labs ordered are listed, but only abnormal results are displayed)  Labs Reviewed  WET PREP, GENITAL - Abnormal; Notable for the following:    WBC, Wet Prep HPF POC MODERATE (*)    All other components within normal limits  COMPREHENSIVE METABOLIC PANEL - Abnormal; Notable for the following:    CO2 21 (*)    Glucose, Bld 110 (*)    All other components within normal limits  CBC - Abnormal; Notable for the following:    Hemoglobin 11.4 (*)    MCV 78.6 (*)    MCH 25.7 (*)    RDW 15.4 (*)    All other components within normal limits  URINALYSIS COMPLETEWITH MICROSCOPIC ([facility] ONLY) - Abnormal; Notable for the following:    Color, Urine YELLOW (*)    APPearance HAZY (*)    Hgb urine dipstick 3+ (*)    Squamous Epithelial / LPF 0-5 (*)    All other components within normal limits  CHLAMYDIA/NGC RT PCR ([facility] ONLY)  LIPASE, BLOOD  POCT PREGNANCY, URINE   ____________________________________________  EKG  None ____________________________________________  RADIOLOGY  Pelvic ultrasound: Small posterior uterine fibroid and small right ovarian simple cyst ____________________________________________   PROCEDURES  Procedure(s) performed: None  Critical Care performed: No  ____________________________________________   INITIAL IMPRESSION / ASSESSMENT AND PLAN / ED COURSE  Pertinent labs & imaging results that were available during my care of the patient were reviewed by me and considered in my medical decision making  (see chart for details).  This is a 30 year old female who comes into the hospital today with some lower abdominal pain and heavy vaginal bleeding. The patient does have a history of fibroids and that is likely the cause of her symptoms. I will have the patient evaluated with an ultrasound and reassess the patient. She'll receive a dose of ibuprofen here in the emergency department.  The patient's ultrasound is unremarkable. She does have a small uterine fibroid. The patient also had minimal blood in her vault and no significant tenderness to palpation. The patient will be discharged home to follow-up with OB/GYN for further evaluation of the symptoms. ____________________________________________   FINAL CLINICAL IMPRESSION(S) / ED DIAGNOSES  Final diagnoses:  Abdominal pain  Uterine leiomyoma, unspecified location  Dysfunctional uterine bleeding      NEW MEDICATIONS STARTED DURING THIS VISIT:  New Prescriptions   No medications on file     Note:  This document was prepared using Dragon voice recognition software and may include unintentional dictation errors.    Loney Hering, MD 03/09/16 [phone]

## 2016-03-09 NOTE — ED Notes (Signed)
Patient transported to Ultrasound 

## 2016-03-09 NOTE — Discharge Instructions (Signed)
Abnormal Uterine Bleeding Abnormal uterine bleeding can affect women at various stages in life, including teenagers, women in their reproductive years, pregnant women, and women who have reached menopause. Several kinds of uterine bleeding are considered abnormal, including:  Bleeding or spotting between periods.   Bleeding after sexual intercourse.   Bleeding that is heavier or more than normal.   Periods that last longer than usual.  Bleeding after menopause.  Many cases of abnormal uterine bleeding are minor and simple to treat, while others are more serious. Any type of abnormal bleeding should be evaluated by your health care provider. Treatment will depend on the cause of the bleeding. HOME CARE INSTRUCTIONS Monitor your condition for any changes. The following actions may help to alleviate any discomfort you are experiencing:  Avoid the use of tampons and douches as directed by your health care provider.  Change your pads frequently. You should get regular pelvic exams and Pap tests. Keep all follow-up appointments for diagnostic tests as directed by your health care provider.  SEEK MEDICAL CARE IF:   Your bleeding lasts more than 1 week.   You feel dizzy at times.  SEEK IMMEDIATE MEDICAL CARE IF:   You pass out.   You are changing pads every 15 to 30 minutes.   You have abdominal pain.  You have a fever.   You become sweaty or weak.   You are passing large blood clots from the vagina.   You start to feel nauseous and vomit. MAKE SURE YOU:   Understand these instructions.  Will watch your condition.  Will get help right away if you are not doing well or get worse.   This information is not intended to replace advice given to you by your health care provider. Make sure you discuss any questions you have with your health care provider.   Document Released: 09/21/2005 Document Revised: 09/26/2013 Document Reviewed: 04/20/2013 Elsevier Interactive  Patient Education 2016 Else[employer]  Abdominal Pain, Adult Many things can cause belly (abdominal) pain. Most times, the belly pain is not dangerous. Many cases of belly pain can be watched and treated at home. HOME CARE   Do not take medicines that help you go poop (laxatives) unless told to by your doctor.  Only take medicine as told by your doctor.  Eat or drink as told by your doctor. Your doctor will tell you if you should be on a special diet. GET HELP IF:  You do not know what is causing your belly pain.  You have belly pain while you are sick to your stomach (nauseous) or have runny poop (diarrhea).  You have pain while you pee or poop.  Your belly pain wakes you up at night.  You have belly pain that gets worse or better when you eat.  You have belly pain that gets worse when you eat fatty foods.  You have a fever. GET HELP RIGHT AWAY IF:   The pain does not go away within 2 hours.  You keep throwing up (vomiting).  The pain changes and is only in the right or left part of the belly.  You have bloody or tarry looking poop. MAKE SURE YOU:   Understand these instructions.  Will watch your condition.  Will get help right away if you are not doing well or get worse.   This information is not intended to replace advice given to you by your health care provider. Make sure you discuss any questions you have with your  health care provider.   Document Released: 03/09/2008 Document Revised: 10/12/2014 Document Reviewed: 05/31/2013 Elsevier Interactive Patient Education 2016 Elsevier Inc.  Uterine Fibroids Uterine fibroids are tissue masses (tumors) that can develop in the womb (uterus). They are also called leiomyomas. This type of tumor is not cancerous (benign) and does not spread to other parts of the body outside of the pelvic area, which is between the hip bones. Occasionally, fibroids may develop in the fallopian tubes, in the cervix, or on the support  structures (ligaments) that surround the uterus. You can have one or many fibroids. Fibroids can vary in size, weight, and where they grow in the uterus. Some can become quite large. Most fibroids do not require medical treatment. CAUSES A fibroid can develop when a single uterine cell keeps growing (replicating). Most cells in the human body have a control mechanism that keeps them from replicating without control. SIGNS AND SYMPTOMS Symptoms may include:   Heavy bleeding during your period.  Bleeding or spotting between periods.  Pelvic pain and pressure.  Bladder problems, such as needing to urinate more often (urinary frequency) or urgently.  Inability to reproduce offspring (infertility).  Miscarriages. DIAGNOSIS Uterine fibroids are diagnosed through a physical exam. Your health care provider may feel the lumpy tumors during a pelvic exam. Ultrasonography and an MRI may be done to determine the size, location, and number of fibroids. TREATMENT Treatment may include:  Watchful waiting. This involves getting the fibroid checked by your health care provider to see if it grows or shrinks. Follow your health care provider's recommendations for how often to have this checked.  Hormone medicines. These can be taken by mouth or given through an intrauterine device (IUD).  Surgery.  Removing the fibroids (myomectomy) or the uterus (hysterectomy).  Removing blood supply to the fibroids (uterine artery embolization). If fibroids interfere with your fertility and you want to become pregnant, your health care provider may recommend having the fibroids removed.  HOME CARE INSTRUCTIONS  Keep all follow-up visits as directed by your health care provider. This is important.  Take medicines only as directed by your health care provider.  If you were prescribed a hormone treatment, take the hormone medicines exactly as directed.  Do not take aspirin, because it can cause bleeding.  Ask  your health care provider about taking iron pills and increasing the amount of dark green, leafy vegetables in your diet. These actions can help to boost your blood iron levels, which may be affected by heavy menstrual bleeding.  Pay close attention to your period and tell your health care provider about any changes, such as:  Increased blood flow that requires you to use more pads or tampons than usual per month.  A change in the number of days that your period lasts per month.  A change in symptoms that are associated with your period, such as abdominal cramping or back pain. SEEK MEDICAL CARE IF:  You have pelvic pain, back pain, or abdominal cramps that cannot be controlled with medicines.  You have an increase in bleeding between and during periods.  You soak tampons or pads in a half hour or less.  You feel lightheaded, extra tired, or weak. SEEK IMMEDIATE MEDICAL CARE IF:  You faint.  You have a sudden increase in pelvic pain.   This information is not intended to replace advice given to you by your health care provider. Make sure you discuss any questions you have with your health care provider.  Document Released: 09/18/2000 Document Revised: 10/12/2014 Document Reviewed: 03/20/2014 Elsevier Interactive Patient Education [employer].

## 2016-03-09 NOTE — ED Notes (Signed)
Patient transported to X-ray 

## 2016-03-09 NOTE — ED Notes (Signed)
Reviewed d/c instructions, follow-up care with pt. Pt verbalized understanding 

## 2017-02-04 ENCOUNTER — Emergency Department
Admission: EM | Admit: 2017-02-04 | Discharge: 2017-02-04 | Disposition: A | Discharge status: Home / Self Care | Payer: Self-pay | Attending: Emergency Medicine | Admitting: Emergency Medicine

## 2017-02-04 ENCOUNTER — Encounter: Payer: Self-pay | Admitting: Emergency Medicine

## 2017-02-04 DIAGNOSIS — I10 Essential (primary) hypertension: Secondary | ICD-10-CM | POA: Insufficient documentation

## 2017-02-04 DIAGNOSIS — Z79899 Other long term (current) drug therapy: Secondary | ICD-10-CM | POA: Insufficient documentation

## 2017-02-04 DIAGNOSIS — F17210 Nicotine dependence, cigarettes, uncomplicated: Secondary | ICD-10-CM | POA: Insufficient documentation

## 2017-02-04 DIAGNOSIS — J301 Allergic rhinitis due to pollen: Secondary | ICD-10-CM | POA: Insufficient documentation

## 2017-02-04 MED ORDER — LIDOCAINE 5 % EX PTCH
MEDICATED_PATCH | CUTANEOUS | Status: AC
  Filled 2017-02-04: qty 1

## 2017-02-04 MED ORDER — FLUTICASONE PROPIONATE 50 MCG/ACT NA SUSP: 2.0000 | Freq: Every day | NASAL | 0 refills | Status: DC

## 2017-02-04 MED ORDER — OXYCODONE-ACETAMINOPHEN 5-325 MG PO TABS
ORAL_TABLET | ORAL | Status: AC
  Filled 2017-02-04: qty 1

## 2017-02-04 MED ORDER — KETOROLAC TROMETHAMINE 30 MG/ML IJ SOLN
INTRAMUSCULAR | Status: AC
  Filled 2017-02-04: qty 1

## 2017-02-04 MED ORDER — LORATADINE-PSEUDOEPHEDRINE ER 5-120 MG PO TB12: 1.0000 | ORAL_TABLET | Freq: Two times a day (BID) | ORAL | 0 refills | Status: DC

## 2017-02-04 NOTE — ED Triage Notes (Signed)
Pt with sinus pressure and congestion for three days.

## 2017-02-04 NOTE — Discharge Instructions (Signed)
Your exam is consistent with sinus allergies and sinus congestion. Take the prescription meds as directed and the nasal spray as directed. Follow-up with [facility] for ongoing symptoms.

## 2017-02-04 NOTE — ED Notes (Signed)
Se triage note  States she developed some sinus pressure and congestion about 3 days ago..unsure of fever but has felt "hot"  Afebrile on arrival.

## 2017-02-07 NOTE — ED Provider Notes (Signed)
[facility] Emergency Department Provider Note ____________________________________________  Time seen: 1308  I have reviewed the triage vital signs and the nursing notes.  HISTORY  Chief Complaint  Nasal Congestion  HPI Sue Hall is a 31 y.o. female Presents to the ED with complaints of sinus pressure and congestion for the last 3 days. She is unsure of any fever but has felt "hot" over the last few days.She has not taken any medications over-the-counter for symptom relief.  Past Medical History:  Diagnosis Date  . Hypertension     There are no active problems to display for this patient.   Past Surgical History:  Procedure Laterality Date  . ECTOPIC PREGNANCY SURGERY Right     Prior to Admission medications   Medication Sig Start Date End Date Taking? Authorizing Provider  cyclobenzaprine (FLEXERIL) 5 MG tablet Take 1 tablet (5 mg total) by mouth 3 (three) times daily as needed for muscle spasms. Do not drive while taking this medication. 03/06/16   Joanne Gavel, MD  fluticasone (FLONASE) 50 MCG/ACT nasal spray Place 2 sprays into both nostrils daily. 02/04/17   Zavannah Deblois, Dannielle Karvonen, PA-C  ibuprofen (ADVIL,MOTRIN) 800 MG tablet Take 1 tablet (800 mg total) by mouth every 8 (eight) hours as needed. 04/01/15   Ruffian, III Luanna Cole, PA-C  loratadine-pseudoephedrine (CLARITIN-D 12 HOUR) 5-120 MG tablet Take 1 tablet by mouth 2 (two) times daily. 02/04/17   Alexandera Kuntzman, Dannielle Karvonen, PA-C  naproxen (NAPROSYN) 500 MG tablet Take 1 tablet (500 mg total) by mouth 2 (two) times daily with a meal. As needed for pain. 03/06/16 03/06/17  Joanne Gavel, MD  polyethylene glycol powder (GLYCOLAX/MIRALAX) powder Dissolve 1 tablespoon in 4-8 ounces of juice or water and drink once daily. 03/06/16   Joanne Gavel, MD    Allergies Patient has no known allergies.  No family history on file.  Social History Social History  Substance Use Topics  . Smoking status:  Current Every Day Smoker    Packs/day: 0.50    Types: Cigarettes  . Smokeless tobacco: Never Used  . Alcohol use No    Review of Systems  Constitutional: Negative for fever. Eyes: Negative for visual changes. ENT: Negative for sore throat. Reports sinus pressure and congestion as above. Cardiovascular: Negative for chest pain. Respiratory: Negative for shortness of breath. Neurological: Negative for headaches, focal weakness or numbness. ____________________________________________  PHYSICAL EXAM:  VITAL SIGNS: ED Triage Vitals  Enc Vitals Group     BP 02/04/17 1152 (!) 126/96     Pulse Rate 02/04/17 1152 97     Resp 02/04/17 1152 20     Temp 02/04/17 1152 98.4 F (36.9 C)     Temp Source 02/04/17 1152 Oral     SpO2 02/04/17 1152 100 %     Weight 02/04/17 1148 160 lb (72.6 kg)     Height 02/04/17 1152 5\' 3"  (1.6 m)     Head Circumference --      Peak Flow --      Pain Score 02/04/17 1148 7     Pain Loc --      Pain Edu? --      Excl. in [place]? --     Constitutional: Alert and oriented. Well appearing and in no distress. Head: Normocephalic and atraumatic. Eyes: Conjunctivae are normal. PERRL. Normal extraocular movements Ears: Canals clear. TMs intact bilaterally. Nose: No congestion/rhinorrhea/epistaxis. Mouth/Throat: Mucous membranes are moist. Uvula is midline and tonsils are flat.  Neck: Supple. No thyromegaly. Hematological/Lymphatic/Immunological: No cervical lymphadenopathy. Cardiovascular: Normal rate, regular rhythm. Normal distal pulses. Respiratory: Normal respiratory effort. No wheezes/rales/rhonchi. Gastrointestinal: Soft and nontender. No distention. ____________________________________________  INITIAL IMPRESSION / ASSESSMENT AND PLAN / ED COURSE  Patient with symptoms consistent with a likely allergic rhinitis due to pollen. She will be discharged with prescriptions for Flonase and Claritin-D to dose as directed. She will follow up with one of the  local commuter clinics for ongoing symptom management. Workup for when she provided as requested. ____________________________________________  FINAL CLINICAL IMPRESSION(S) / ED DIAGNOSES  Final diagnoses:  Allergic rhinitis due to pollen, unspecified seasonality      Carmie End, Dannielle Karvonen, PA-C 02/07/17 0128    Eula Listen, MD 02/08/17 [phone]

## 2017-07-19 ENCOUNTER — Emergency Department
Admission: EM | Admit: 2017-07-19 | Discharge: 2017-07-19 | Disposition: A | Discharge status: Home / Self Care | Payer: Self-pay | Attending: Emergency Medicine | Admitting: Emergency Medicine

## 2017-07-19 DIAGNOSIS — R109 Unspecified abdominal pain: Secondary | ICD-10-CM | POA: Insufficient documentation

## 2017-07-19 DIAGNOSIS — F17210 Nicotine dependence, cigarettes, uncomplicated: Secondary | ICD-10-CM | POA: Insufficient documentation

## 2017-07-19 DIAGNOSIS — B349 Viral infection, unspecified: Secondary | ICD-10-CM | POA: Insufficient documentation

## 2017-07-19 DIAGNOSIS — Z79899 Other long term (current) drug therapy: Secondary | ICD-10-CM | POA: Insufficient documentation

## 2017-07-19 DIAGNOSIS — N912 Amenorrhea, unspecified: Secondary | ICD-10-CM | POA: Insufficient documentation

## 2017-07-19 DIAGNOSIS — I10 Essential (primary) hypertension: Secondary | ICD-10-CM | POA: Insufficient documentation

## 2017-07-19 LAB — CBC
HCT: 34.9 % — ABNORMAL LOW (ref 35.0–47.0)
Hemoglobin: 11 g/dL — ABNORMAL LOW (ref 12.0–16.0)
MCH: 23.2 pg — ABNORMAL LOW (ref 26.0–34.0)
MCHC: 31.5 g/dL — ABNORMAL LOW (ref 32.0–36.0)
MCV: 73.8 fL — ABNORMAL LOW (ref 80.0–100.0)
Platelets: 322 K/uL (ref 150–440)
RBC: 4.74 MIL/uL (ref 3.80–5.20)
RDW: 17.2 % — ABNORMAL HIGH (ref 11.5–14.5)
WBC: 8.3 K/uL (ref 3.6–11.0)

## 2017-07-19 LAB — URINALYSIS, COMPLETE (UACMP) WITH MICROSCOPIC
Bacteria, UA: NONE SEEN
Bilirubin Urine: NEGATIVE
Glucose, UA: NEGATIVE mg/dL
Ketones, ur: NEGATIVE mg/dL
Leukocytes, UA: NEGATIVE
Nitrite: NEGATIVE
Protein, ur: NEGATIVE mg/dL
RBC / HPF: NONE SEEN RBC/hpf (ref 0–5)
Specific Gravity, Urine: 1.016 (ref 1.005–1.030)
Squamous Epithelial / HPF: NONE SEEN
pH: 5 (ref 5.0–8.0)

## 2017-07-19 LAB — COMPREHENSIVE METABOLIC PANEL WITH GFR
ALT: 22 U/L (ref 14–54)
AST: 28 U/L (ref 15–41)
Albumin: 4.1 g/dL (ref 3.5–5.0)
Alkaline Phosphatase: 75 U/L (ref 38–126)
Anion gap: 7 (ref 5–15)
BUN: 7 mg/dL (ref 6–20)
CO2: 24 mmol/L (ref 22–32)
Calcium: 10.2 mg/dL (ref 8.9–10.3)
Chloride: 104 mmol/L (ref 101–111)
Creatinine, Ser: 0.85 mg/dL (ref 0.44–1.00)
GFR calc Af Amer: >60 mL/min
GFR calc non Af Amer: >60 mL/min
Glucose, Bld: 134 mg/dL — ABNORMAL HIGH (ref 65–99)
Potassium: 3.3 mmol/L — ABNORMAL LOW (ref 3.5–5.1)
Sodium: 135 mmol/L (ref 135–145)
Total Bilirubin: 1 mg/dL (ref 0.3–1.2)
Total Protein: 8.5 g/dL — ABNORMAL HIGH (ref 6.5–8.1)

## 2017-07-19 LAB — LIPASE, BLOOD: Lipase: 27 U/L (ref 11–51)

## 2017-07-19 LAB — POCT PREGNANCY, URINE: Preg Test, Ur: NEGATIVE

## 2017-07-19 MED ORDER — ONDANSETRON HCL 4 MG PO TABS: 4.0000 mg | ORAL_TABLET | Freq: Every day | ORAL | 0 refills | Status: DC | PRN

## 2017-07-19 MED ORDER — ONDANSETRON 4 MG PO TBDP
4.0000 mg | ORAL_TABLET | Freq: Once | ORAL | Status: AC
  Administered 2017-07-19: 4 mg via ORAL
  Filled 2017-07-19: qty 1

## 2017-07-19 NOTE — ED Provider Notes (Signed)
[facility] Emergency Department Provider Note  ____________________________________________   First MD Initiated Contact with Patient 07/19/17 1513     (approximate)  I have reviewed the triage vital signs and the nursing notes.   HISTORY  Chief Complaint Nausea and Dizziness   HPI Sue Hall is a 31 y.o. female with a history of ectopic pregnancy was presenting to the emergency department today with rhinorrhea, intermittent abdominal pain which is positional as well as nausea over the past week. She is not reporting fever. Denies any known sick contacts. Says that she was concerned about passing out at work. She says that she is also not had her period since August which is abnormal for her. She says that she usually has a regular period every month. She is denying any vaginal bleeding or discharge. Says that she is also not sleeping normally for her and only is getting several hours of sleep per night.denies any abdominal pain at this time.   Past Medical History:  Diagnosis Date  . Hypertension     There are no active problems to display for this patient.   Past Surgical History:  Procedure Laterality Date  . ECTOPIC PREGNANCY SURGERY Right     Prior to Admission medications   Medication Sig Start Date End Date Taking? Authorizing Provider  cyclobenzaprine (FLEXERIL) 5 MG tablet Take 1 tablet (5 mg total) by mouth 3 (three) times daily as needed for muscle spasms. Do not drive while taking this medication. 03/06/16   Joanne Gavel, MD  fluticasone (FLONASE) 50 MCG/ACT nasal spray Place 2 sprays into both nostrils daily. 02/04/17   Menshew, Dannielle Karvonen, PA-C  ibuprofen (ADVIL,MOTRIN) 800 MG tablet Take 1 tablet (800 mg total) by mouth every 8 (eight) hours as needed. 04/01/15   Ruffian, III Luanna Cole, PA-C  loratadine-pseudoephedrine (CLARITIN-D 12 HOUR) 5-120 MG tablet Take 1 tablet by mouth 2 (two) times daily. 02/04/17   Menshew, Dannielle Karvonen, PA-C  polyethylene glycol powder (GLYCOLAX/MIRALAX) powder Dissolve 1 tablespoon in 4-8 ounces of juice or water and drink once daily. 03/06/16   Joanne Gavel, MD    Allergies Patient has no known allergies.  No family history on file.  Social History Social History  Substance Use Topics  . Smoking status: Current Every Day Smoker    Packs/day: 0.50    Types: Cigarettes  . Smokeless tobacco: Never Used  . Alcohol use No    Review of Systems  Constitutional: No fever/chills Eyes: No visual changes. ENT: No sore throat. Cardiovascular: Denies chest pain. Respiratory: Denies shortness of breath. Gastrointestinal: No abdominal pain.  vomiting.  No diarrhea.  No constipation. Genitourinary: Negative for dysuria. Musculoskeletal: Negative for back pain. Skin: Negative for rash. Neurological: Negative for headaches, focal weakness or numbness.   ____________________________________________   PHYSICAL EXAM:  VITAL SIGNS: ED Triage Vitals [07/19/17 1457]  Enc Vitals Group     BP (!) 175/93     Pulse Rate (!) 108     Resp 18     Temp 98.9 F (37.2 C)     Temp Source Oral     SpO2 98 %     Weight 155 lb (70.3 kg)     Height 5\' 3"  (1.6 m)     Head Circumference      Peak Flow      Pain Score 7     Pain Loc      Pain Edu?  Excl. in [place]?     Constitutional: Alert and oriented. Well appearing and in no acute distress. Eyes: Conjunctivae are normal.  Head: Atraumatic.normal TMs bilaterally with a mild to moderate amount of cerumen to the bilateral external canals. Nose: rhinorrhea to the bilateral nares. Mouth/Throat: Mucous membranes are moist.  Neck: No stridor.   Cardiovascular: Normal rate, regular rhythm. Grossly normal heart sounds.  Respiratory: Normal respiratory effort.  No retractions. Lungs CTAB. Gastrointestinal: Soft and nontender. No distention. Musculoskeletal: No lower extremity tenderness nor edema.  No joint effusions. Neurologic:   Normal speech and language. No gross focal neurologic deficits are appreciated. Skin:  Skin is warm, dry and intact. No rash noted. Psychiatric: Mood and affect are normal. Speech and behavior are normal.  ____________________________________________   LABS (all labs ordered are listed, but only abnormal results are displayed)  Labs Reviewed  COMPREHENSIVE METABOLIC PANEL - Abnormal; Notable for the following:       Result Value   Potassium 3.3 (*)    Glucose, Bld 134 (*)    Total Protein 8.5 (*)    All other components within normal limits  CBC - Abnormal; Notable for the following:    Hemoglobin 11.0 (*)    HCT 34.9 (*)    MCV 73.8 (*)    MCH 23.2 (*)    MCHC 31.5 (*)    RDW 17.2 (*)    All other components within normal limits  URINALYSIS, COMPLETE (UACMP) WITH MICROSCOPIC - Abnormal; Notable for the following:    Color, Urine YELLOW (*)    APPearance CLEAR (*)    Hgb urine dipstick SMALL (*)    All other components within normal limits  LIPASE, BLOOD  POC URINE PREG, ED  POCT PREGNANCY, URINE   ____________________________________________  EKG  ED ECG REPORT I, Kihanna Kamiya,  Youlanda Roys, the attending physician, personally viewed and interpreted this ECG.   Date: 07/19/2017  EKG Time: 1503  Rate: 105  Rhythm: sinus tachycardia  Axis: normal  Intervals:none  ST&T Change: no ST segment elevation or depression. No abnormal T-wave inversion.  ____________________________________________  RADIOLOGY   ____________________________________________   PROCEDURES  Procedure(s) performed:   Procedures  Critical Care performed:   ____________________________________________   INITIAL IMPRESSION / ASSESSMENT AND PLAN / ED COURSE  Pertinent labs & imaging results that were available during my care of the patient were reviewed by me and considered in my medical decision making (see chart for details).   DDX: Nausea, viral syndrome, dehydration, UTI, uterine  fibroids, amenorrhea, pregnancy  As part of my medical decision making, I reviewed the following data within the [facility] Notes from prior ED visits.  Also reviewed the patient's ultrasound from 2017 which showed a small fibroid about 2.5 cm at its largest dimension.   ----------------------------------------- 4:33 PM on 07/19/2017 -----------------------------------------  Patient with very reassuring lab work. Says that she feels much better with Zofran. Continues to be abdominal pain-free. Likely viral syndrome. Patient to follow up with her OB/GYN for amenorrhea. Possible stress induced from lack of sleep. She is understanding of the diagnosis as well as the plan and willing to comply.     ____________________________________________   FINAL CLINICAL IMPRESSION(S) / ED DIAGNOSES  viral syndrome. Amenorrhea.    NEW MEDICATIONS STARTED DURING THIS VISIT:  New Prescriptions   No medications on file     Note:  This document was prepared using Dragon voice recognition software and may include unintentional dictation errors.  Orbie Pyo, MD 07/19/17 [phone]

## 2017-07-19 NOTE — ED Triage Notes (Signed)
Abdominal pain, nausea, lightheaded X 2 days. Pt alert and oriented X4, active, cooperative, pt in NAD. RR even and unlabored, color WNL.  No emesis or diarrhea.

## 2017-07-19 NOTE — ED Notes (Signed)
Pt presents with "not feeling well" x 1 week - nausea, fatigue, abdominal pain intermittently. Pt denies vomiting or diarrhea. Pt states she feels lightheaded. Pt alert & oriented with NAD noted.

## 2017-08-06 ENCOUNTER — Emergency Department: Admission: EM | Admit: 2017-08-06 | Discharge: 2017-08-06 | Discharge status: Against Medical Advice | Payer: Self-pay

## 2018-02-06 ENCOUNTER — Encounter: Payer: Self-pay | Admitting: Radiology

## 2018-02-06 ENCOUNTER — Observation Stay
Admission: EM | Admit: 2018-02-06 | Discharge: 2018-02-08 | Disposition: A | Discharge status: Home / Self Care | Payer: Self-pay | Attending: Obstetrics and Gynecology | Admitting: Obstetrics and Gynecology

## 2018-02-06 ENCOUNTER — Other Ambulatory Visit: Payer: Self-pay

## 2018-02-06 ENCOUNTER — Emergency Department: Payer: Self-pay

## 2018-02-06 DIAGNOSIS — Z79899 Other long term (current) drug therapy: Secondary | ICD-10-CM | POA: Insufficient documentation

## 2018-02-06 DIAGNOSIS — A5424 Gonococcal female pelvic inflammatory disease: Principal | ICD-10-CM | POA: Insufficient documentation

## 2018-02-06 DIAGNOSIS — K5900 Constipation, unspecified: Secondary | ICD-10-CM | POA: Insufficient documentation

## 2018-02-06 DIAGNOSIS — A419 Sepsis, unspecified organism: Secondary | ICD-10-CM

## 2018-02-06 DIAGNOSIS — F17210 Nicotine dependence, cigarettes, uncomplicated: Secondary | ICD-10-CM | POA: Insufficient documentation

## 2018-02-06 DIAGNOSIS — N730 Acute parametritis and pelvic cellulitis: Secondary | ICD-10-CM

## 2018-02-06 DIAGNOSIS — D649 Anemia, unspecified: Secondary | ICD-10-CM | POA: Insufficient documentation

## 2018-02-06 DIAGNOSIS — I10 Essential (primary) hypertension: Secondary | ICD-10-CM | POA: Insufficient documentation

## 2018-02-06 DIAGNOSIS — N760 Acute vaginitis: Secondary | ICD-10-CM | POA: Insufficient documentation

## 2018-02-06 DIAGNOSIS — D259 Leiomyoma of uterus, unspecified: Secondary | ICD-10-CM | POA: Insufficient documentation

## 2018-02-06 DIAGNOSIS — N739 Female pelvic inflammatory disease, unspecified: Secondary | ICD-10-CM | POA: Diagnosis present

## 2018-02-06 LAB — URINALYSIS, COMPLETE (UACMP) WITH MICROSCOPIC
Bacteria, UA: NONE SEEN
Bilirubin Urine: NEGATIVE
Glucose, UA: NEGATIVE mg/dL
KETONES UR: NEGATIVE mg/dL
Nitrite: NEGATIVE
PH: 7 (ref 5.0–8.0)
Protein, ur: NEGATIVE mg/dL
SPECIFIC GRAVITY, URINE: 1.004 — AB (ref 1.005–1.030)

## 2018-02-06 LAB — COMPREHENSIVE METABOLIC PANEL
ALBUMIN: 4.1 g/dL (ref 3.5–5.0)
ALT: 16 U/L (ref 14–54)
ANION GAP: 8 (ref 5–15)
AST: 27 U/L (ref 15–41)
Alkaline Phosphatase: 80 U/L (ref 38–126)
CO2: 24 mmol/L (ref 22–32)
Calcium: 9.6 mg/dL (ref 8.9–10.3)
Chloride: 101 mmol/L (ref 101–111)
Creatinine, Ser: 0.68 mg/dL (ref 0.44–1.00)
GFR calc Af Amer: >60 mL/min (ref >60)
GFR calc non Af Amer: >60 mL/min (ref >60)
GLUCOSE: 103 mg/dL — AB (ref 65–99)
POTASSIUM: 3.7 mmol/L (ref 3.5–5.1)
SODIUM: 133 mmol/L — AB (ref 135–145)
Total Bilirubin: 1.4 mg/dL — ABNORMAL HIGH (ref 0.3–1.2)
Total Protein: 8.9 g/dL — ABNORMAL HIGH (ref 6.5–8.1)

## 2018-02-06 LAB — CHLAMYDIA/NGC RT PCR (ARMC ONLY)
CHLAMYDIA TR: NOT DETECTED
N GONORRHOEAE: DETECTED — AB

## 2018-02-06 LAB — CBC WITH DIFFERENTIAL/PLATELET
Basophils Absolute: 0.1 10*3/uL (ref 0–0.1)
Basophils Relative: 1 %
Eosinophils Absolute: 0 10*3/uL (ref 0–0.7)
Eosinophils Relative: 0 %
HEMATOCRIT: 31 % — AB (ref 35.0–47.0)
Hemoglobin: 9.9 g/dL — ABNORMAL LOW (ref 12.0–16.0)
LYMPHS PCT: 7 %
Lymphs Abs: 1 10*3/uL (ref 1.0–3.6)
MCH: 23.9 pg — ABNORMAL LOW (ref 26.0–34.0)
MCHC: 31.9 g/dL — AB (ref 32.0–36.0)
MCV: 75 fL — AB (ref 80.0–100.0)
MONO ABS: 0.5 10*3/uL (ref 0.2–0.9)
MONOS PCT: 3 %
NEUTROS ABS: 13.8 10*3/uL — AB (ref 1.4–6.5)
Neutrophils Relative %: 89 %
Platelets: 289 10*3/uL (ref 150–440)
RBC: 4.13 MIL/uL (ref 3.80–5.20)
RDW: 17.5 % — AB (ref 11.5–14.5)
WBC: 15.4 10*3/uL — ABNORMAL HIGH (ref 3.6–11.0)

## 2018-02-06 LAB — LACTIC ACID, PLASMA: Lactic Acid, Venous: 1.3 mmol/L (ref 0.5–1.9)

## 2018-02-06 LAB — WET PREP, GENITAL
Sperm: NONE SEEN
Trich, Wet Prep: NONE SEEN
Yeast Wet Prep HPF POC: NONE SEEN

## 2018-02-06 LAB — POCT PREGNANCY, URINE: Preg Test, Ur: NEGATIVE

## 2018-02-06 MED ORDER — MENTHOL 3 MG MT LOZG
1.0000 | LOZENGE | OROMUCOSAL | Status: DC | PRN
  Filled 2018-02-06: qty 9

## 2018-02-06 MED ORDER — KETOROLAC TROMETHAMINE 30 MG/ML IJ SOLN
30.0000 mg | Freq: Four times a day (QID) | INTRAMUSCULAR | Status: AC
  Administered 2018-02-07 (×2): 30 mg via INTRAVENOUS
  Filled 2018-02-06 (×2): qty 1

## 2018-02-06 MED ORDER — SIMETHICONE 80 MG PO CHEW: 80.0000 mg | CHEWABLE_TABLET | Freq: Four times a day (QID) | ORAL | Status: DC | PRN

## 2018-02-06 MED ORDER — SODIUM CHLORIDE 0.9 % IV SOLN
100.0000 mg | Freq: Two times a day (BID) | INTRAVENOUS | Status: DC
  Administered 2018-02-06 – 2018-02-08 (×3): 100 mg via INTRAVENOUS
  Filled 2018-02-06 (×5): qty 100

## 2018-02-06 MED ORDER — SODIUM CHLORIDE 0.9 % IV SOLN
2.0000 g | Freq: Four times a day (QID) | INTRAVENOUS | Status: DC
  Administered 2018-02-06 – 2018-02-08 (×7): 2 g via INTRAVENOUS
  Filled 2018-02-06 (×9): qty 2

## 2018-02-06 MED ORDER — METRONIDAZOLE IN NACL 5-0.79 MG/ML-% IV SOLN
INTRAVENOUS | Status: AC
  Administered 2018-02-06: 500 mg via INTRAVENOUS
  Filled 2018-02-06: qty 100

## 2018-02-06 MED ORDER — KETOROLAC TROMETHAMINE 30 MG/ML IJ SOLN: 30.0000 mg | Freq: Four times a day (QID) | INTRAMUSCULAR | Status: AC

## 2018-02-06 MED ORDER — ALUM & MAG HYDROXIDE-SIMETH 200-200-20 MG/5ML PO SUSP: 30.0000 mL | ORAL | Status: DC | PRN

## 2018-02-06 MED ORDER — HYDROMORPHONE HCL 1 MG/ML IJ SOLN
0.2000 mg | INTRAMUSCULAR | Status: DC | PRN
  Administered 2018-02-07 (×4): 0.4 mg via INTRAVENOUS
  Administered 2018-02-07: 0.6 mg via INTRAVENOUS
  Administered 2018-02-07: 0.4 mg via INTRAVENOUS
  Administered 2018-02-08 (×2): 0.6 mg via INTRAVENOUS
  Filled 2018-02-06 (×9): qty 1

## 2018-02-06 MED ORDER — KETOROLAC TROMETHAMINE 30 MG/ML IJ SOLN
30.0000 mg | Freq: Once | INTRAMUSCULAR | Status: AC
  Administered 2018-02-06: 30 mg via INTRAVENOUS
  Filled 2018-02-06: qty 1

## 2018-02-06 MED ORDER — DOCUSATE SODIUM 100 MG PO CAPS
100.0000 mg | ORAL_CAPSULE | Freq: Two times a day (BID) | ORAL | Status: DC
  Administered 2018-02-07 – 2018-02-08 (×4): 100 mg via ORAL
  Filled 2018-02-06 (×4): qty 1

## 2018-02-06 MED ORDER — IOPAMIDOL (ISOVUE-370) INJECTION 76%
75.0000 mL | Freq: Once | INTRAVENOUS | Status: AC | PRN
  Administered 2018-02-06: 75 mL via INTRAVENOUS

## 2018-02-06 MED ORDER — ONDANSETRON HCL 4 MG PO TABS: 4.0000 mg | ORAL_TABLET | Freq: Four times a day (QID) | ORAL | Status: DC | PRN

## 2018-02-06 MED ORDER — SODIUM CHLORIDE 0.9 % IV BOLUS
1000.0000 mL | Freq: Once | INTRAVENOUS | Status: AC
  Administered 2018-02-06: 1000 mL via INTRAVENOUS

## 2018-02-06 MED ORDER — MORPHINE SULFATE (PF) 4 MG/ML IV SOLN
4.0000 mg | Freq: Once | INTRAVENOUS | Status: AC
  Administered 2018-02-06: 4 mg via INTRAVENOUS
  Filled 2018-02-06: qty 1

## 2018-02-06 MED ORDER — ONDANSETRON HCL 4 MG/2ML IJ SOLN
4.0000 mg | Freq: Once | INTRAMUSCULAR | Status: AC
  Administered 2018-02-06: 4 mg via INTRAVENOUS
  Filled 2018-02-06: qty 2

## 2018-02-06 MED ORDER — ONDANSETRON HCL 4 MG/2ML IJ SOLN
4.0000 mg | Freq: Four times a day (QID) | INTRAMUSCULAR | Status: DC | PRN
  Administered 2018-02-07: 4 mg via INTRAVENOUS
  Filled 2018-02-06: qty 2

## 2018-02-06 MED ORDER — LACTATED RINGERS IV SOLN
INTRAVENOUS | Status: DC
  Administered 2018-02-06 – 2018-02-07 (×3): via INTRAVENOUS

## 2018-02-06 MED ORDER — PIPERACILLIN-TAZOBACTAM 3.375 G IVPB 30 MIN
3.3750 g | Freq: Once | INTRAVENOUS | Status: AC
  Administered 2018-02-06: 3.375 g via INTRAVENOUS
  Filled 2018-02-06: qty 50

## 2018-02-06 MED ORDER — METRONIDAZOLE IN NACL 5-0.79 MG/ML-% IV SOLN
500.0000 mg | Freq: Two times a day (BID) | INTRAVENOUS | Status: DC
  Administered 2018-02-06 – 2018-02-08 (×4): 500 mg via INTRAVENOUS
  Filled 2018-02-06 (×4): qty 100

## 2018-02-06 NOTE — ED Notes (Signed)
Pt up to the bedside toilet. Pain reported to have increased upon sitting but pt is able to sit and stand without difficulty. NAD at this time.

## 2018-02-06 NOTE — ED Notes (Signed)
Patient transported to CT 

## 2018-02-06 NOTE — Consult Note (Addendum)
Consult History and Physical   SERVICE: Gynecology  Patient Name: Sue Hall Patient MRN:   [id number]  CC: Lower abdominal pain  HPI: Sue Hall is a 32 y.o. F G1P0010 presenting with 2 days worsening bilateral lower abdominal pain and fever. No recent hx of UTI, vaginal discharge. Mild dysuria. Pain worse at umbilicus.   In ER, +BV but neg chlamydia, but positive for gonorrhea. WBC elevated. No urinary signs.   Imaging wnl - TVUS and CT scan neg for hemorrhagic cysts.  Review of Systems: positives in bold GEN:   fevers, chills, weight changes, appetite changes, fatigue, night sweats HEENT:  HA, vision changes, hearing loss, congestion, rhinorrhea, sinus pressure, dysphagia CV:   CP, palpitations PULM:  SOB, cough GI:  abd pain, N/V/D/C GU:  dysuria, urgency, frequency MSK:  arthralgias, myalgias, back pain, swelling SKIN:  rashes, color changes, pallor NEURO:  numbness, weakness, tingling, seizures, dizziness, tremors PSYCH:  depression, anxiety, behavioral problems, confusion  HEME/LYMPH:  easy bruising or bleeding ENDO:  heat/cold intolerance  Past Obstetrical History: OB History   None     Past Gynecologic History: Patient's last menstrual period was 02/02/2018 (within days). Menstrual frequency Q 4 wks lasting 5 days  Past Medical History: Past Medical History:  Diagnosis Date  . Hypertension     Past Surgical History:   Past Surgical History:  Procedure Laterality Date  . ECTOPIC PREGNANCY SURGERY Right     Family History:  family history is not on file.  Social History:  Social History   Socioeconomic History  . Marital status: Single    Spouse name: Not on file  . Number of children: Not on file  . Years of education: Not on file  . Highest education level: Not on file  Occupational History  . Not on file  Social Needs  . Financial resource strain: Not on file  . Food insecurity:    Worry: Not on file    Inability: Not  on file  . Transportation needs:    Medical: Not on file    Non-medical: Not on file  Tobacco Use  . Smoking status: Current Every Day Smoker    Packs/day: 0.50    Types: Cigarettes  . Smokeless tobacco: Never Used  Substance and Sexual Activity  . Alcohol use: No  . Drug use: No  . Sexual activity: Not on file  Lifestyle  . Physical activity:    Days per week: Not on file    Minutes per session: Not on file  . Stress: Not on file  Relationships  . Social connections:    Talks on phone: Not on file    Gets together: Not on file    Attends religious service: Not on file    Active member of club or organization: Not on file    Attends meetings of clubs or organizations: Not on file    Relationship status: Not on file  . Intimate partner violence:    Fear of current or ex partner: Not on file    Emotionally abused: Not on file    Physically abused: Not on file    Forced sexual activity: Not on file  Other Topics Concern  . Not on file  Social History Narrative  . Not on file    Home Medications:  Medications reconciled in EPIC  No current facility-administered medications on file prior to encounter.    Current Outpatient Medications on File Prior to Encounter  Medication Sig Dispense Refill  .  Prenatal Vit-Fe Fumarate-FA (PRENATAL MULTIVITAMIN) TABS tablet Take 1 tablet by mouth daily at 12 noon.      Allergies:  No Known Allergies  Physical Exam:  Temp:  [102 F (38.9 C)] 102 F (38.9 C) (05/05 1246) Pulse Rate:  [93-123] 93 (05/05 1600) Resp:  [16-20] 16 (05/05 1600) BP: (134-138)/(75-85) 138/75 (05/05 1600) SpO2:  [99 %-100 %] 99 % (05/05 1600) Weight:  [79.4 kg (175 lb)] 79.4 kg (175 lb) (05/05 1246)   General Appearance:  Well developed, well nourished, no acute distress, alert and oriented x3 HEENT:  Normocephalic atraumatic, extraocular movements intact, moist mucous membranes Cardiovascular:  Normal S1/S2, regular rate and rhythm, no  murmurs Pulmonary:  clear to auscultation, no wheezes, rales or rhonchi, symmetric air entry, good air exchange Abdomen:  Bowel sounds present, soft, diffusely tender throughout, does not localize to side, biulateral lower quadrants and at umbilicus, no rosving's sign, McBurney's positive, Murphy's negative, nondistended, no abnormal masses, no epigastric pain Extremities:  Full range of motion, no pedal edema, 2no tenderness Skin:  normal coloration and turgor, no rashes, no suspicious skin lesions noted  Neurologic:  Cranial nerves 2-12 grossly intact, normal muscle tone,  Psychiatric:  Normal mood and affect, appropriate, no AH/VH Pelvic:  Deferred; per ER staff, purulent vaginal discharge, +CMT, +adnexal tenderness   Labs/Studies:   CBC and Coags:  Lab Results  Component Value Date   WBC 15.4 (H) 02/06/2018   NEUTOPHILPCT 89 02/06/2018   EOSPCT 0 02/06/2018   BASOPCT 1 02/06/2018   LYMPHOPCT 7 02/06/2018   HGB 9.9 (L) 02/06/2018   HCT 31.0 (L) 02/06/2018   MCV 75.0 (L) 02/06/2018   PLT 289 02/06/2018   CMP:  Lab Results  Component Value Date   NA 133 (L) 02/06/2018   K 3.7 02/06/2018   CL 101 02/06/2018   CO2 24 02/06/2018   BUN <5 (L) 02/06/2018   CREATININE 0.68 02/06/2018   CREATININE 0.85 07/19/2017   CREATININE 0.70 03/08/2016   PROT 8.9 (H) 02/06/2018   BILITOT 1.4 (H) 02/06/2018   ALT 16 02/06/2018   AST 27 02/06/2018   ALKPHOS 80 02/06/2018   Other Imaging: Ct Abdomen Pelvis W Contrast  Result Date: 02/06/2018 CLINICAL DATA:  32 year old female with history of lower abdominal pain for the past 2 days. Nausea. EXAM: CT ABDOMEN AND PELVIS WITH CONTRAST TECHNIQUE: Multidetector CT imaging of the abdomen and pelvis was performed using the standard protocol following bolus administration of intravenous contrast. CONTRAST:  32mL ISOVUE-370 IOPAMIDOL (ISOVUE-370) INJECTION 76% COMPARISON:  No priors. FINDINGS: Lower chest: Unremarkable. Hepatobiliary: No suspicious  cystic or solid hepatic lesions. No intra or extrahepatic biliary ductal dilatation. Gallbladder is normal in appearance. Pancreas: No pancreatic mass. No pancreatic ductal dilatation. No pancreatic or peripancreatic fluid or inflammatory changes. Spleen: Unremarkable. Adrenals/Urinary Tract: Bilateral kidneys and bilateral adrenal glands are normal in appearance. No hydroureteronephrosis. Urinary bladder is normal in appearance. Stomach/Bowel: Normal appearance of the stomach. No pathologic dilatation of small bowel or colon. Normal appendix (retrocecal in location). Vascular/Lymphatic: No significant atherosclerotic disease, aneurysm or dissection noted in the abdominal or pelvic vasculature. No lymphadenopathy noted in the abdomen or pelvis. Reproductive: Several small uterine lesions are noted is a heterogeneously, the largest of which enhancing lesion in the posterior aspect of the uterine body measuring 2.6 cm in diameter (sagittal image 56 of series 6), most compatible with small fibroids. Ovaries are unremarkable in appearance. Other: No significant volume of ascites.  No pneumoperitoneum. Musculoskeletal: There are  no aggressive appearing lytic or blastic lesions noted in the visualized portions of the skeleton. IMPRESSION: 1. No acute findings are noted in the abdomen or pelvis to account for the patient's symptoms. 2. Normal appendix. 3. Fibroid uterus. Electronically Signed   By: Vinnie Langton M.D.   On: 02/06/2018 15:13     Assessment / Plan:   Sue Hall is a 32 y.o. G1P0010 who presents with sx of PID.   1.  Pt meets hospital admission criteria based on level of pain, fever, rigors and presence of mucopurulent discharge along with radiologic findings.  - Per Uptodate guidelines: plan to use cefoxitin 2 g IV Q6hrs and doxycyline 100mg   IV Q12hrs; plan to transition to PO Doxycycline 100mg  PO BID x 14d once improving. -  Will provide Norco PO for moderate pain, or Morphine IV for  pain management.  - Anti-emetic, sleep medication and IV Fluids ordered - Will consider consultation to infectious dz if condition does not improve with IV antibiotics.  - f/u blood cultures, urine culture.  - Regular diet - ambulation as tolerated

## 2018-02-06 NOTE — ED Notes (Signed)
Pt reports pain has started to return. Pt grimacing at this time admission orders for pain medication released.

## 2018-02-06 NOTE — ED Triage Notes (Addendum)
Pt reports lower abdominal pain x 2 days which she describes as a pinching and stabbing sensation that is constant.  Denies any dysuria. Denies vaginal discharge. Denies N/V/D

## 2018-02-06 NOTE — Progress Notes (Signed)
CODE SEPSIS - PHARMACY COMMUNICATION  **Broad Spectrum Antibiotics should be administered within 1 hour of Sepsis diagnosis**  Time Code Sepsis Called/Page Received: 1356  Antibiotics Ordered: Zosyn  Time of 1st antibiotic administration: 1446  Additional action taken by pharmacy:   If necessary, Name of Provider/Nurse Contacted:     Napoleon Form ,PharmD Clinical Pharmacist  02/06/2018  3:13 PM

## 2018-02-06 NOTE — ED Provider Notes (Signed)
[facility] Emergency Department Provider Note  ___________________________________________   First MD Initiated Contact with Patient 02/06/18 1333     (approximate)  I have reviewed the triage vital signs and the nursing notes.   HISTORY  Chief Complaint Abdominal Pain   HPI Sue Hall is a 32 y.o. female with a history of hypertension and right-sided ectopic pregnancy was presented to the emergency department today with 2 days of lower abdominal pain which is worsening.  She says that it feels like a sharp pain that radiates to her back that is bilateral but worse on the left side.  Associated nausea but no vomiting or diarrhea.  No other abdominal surgeries besides the right-sided ectopic.  Denies any vaginal discharge or bleeding and says that she just finished her period 1 day ago.  Past Medical History:  Diagnosis Date  . Hypertension     There are no active problems to display for this patient.   Past Surgical History:  Procedure Laterality Date  . ECTOPIC PREGNANCY SURGERY Right     Prior to Admission medications   Medication Sig Start Date End Date Taking? Authorizing Provider  cyclobenzaprine (FLEXERIL) 5 MG tablet Take 1 tablet (5 mg total) by mouth 3 (three) times daily as needed for muscle spasms. Do not drive while taking this medication. 03/06/16   Joanne Gavel, MD  fluticasone (FLONASE) 50 MCG/ACT nasal spray Place 2 sprays into both nostrils daily. 02/04/17   Menshew, Dannielle Karvonen, PA-C  ibuprofen (ADVIL,MOTRIN) 800 MG tablet Take 1 tablet (800 mg total) by mouth every 8 (eight) hours as needed. 04/01/15   Ruffian, III Luanna Cole, PA-C  loratadine-pseudoephedrine (CLARITIN-D 12 HOUR) 5-120 MG tablet Take 1 tablet by mouth 2 (two) times daily. 02/04/17   Menshew, Dannielle Karvonen, PA-C  ondansetron (ZOFRAN) 4 MG tablet Take 1 tablet (4 mg total) by mouth daily as needed. 07/19/17   Devonne Kitchen, Randall An, MD  polyethylene glycol  powder (GLYCOLAX/MIRALAX) powder Dissolve 1 tablespoon in 4-8 ounces of juice or water and drink once daily. 03/06/16   Joanne Gavel, MD    Allergies Patient has no known allergies.  No family history on file.  Social History Social History   Tobacco Use  . Smoking status: Current Every Day Smoker    Packs/day: 0.50    Types: Cigarettes  . Smokeless tobacco: Never Used  Substance Use Topics  . Alcohol use: No  . Drug use: No    Review of Systems  Constitutional: No fever/chills Eyes: No visual changes. ENT: No sore throat. Cardiovascular: Denies chest pain. Respiratory: Denies shortness of breath. Gastrointestinal: no vomiting.  No diarrhea.  No constipation. Genitourinary: Negative for dysuria. Musculoskeletal: As above Skin: Negative for rash. Neurological: Negative for headaches, focal weakness or numbness.   ____________________________________________   PHYSICAL EXAM:  VITAL SIGNS: ED Triage Vitals  Enc Vitals Group     BP 02/06/18 1246 134/85     Pulse Rate 02/06/18 1246 (!) 123     Resp 02/06/18 1246 20     Temp 02/06/18 1246 (!) 102 F (38.9 C)     Temp Source 02/06/18 1246 Oral     SpO2 02/06/18 1246 100 %     Weight 02/06/18 1246 175 lb (79.4 kg)     Height 02/06/18 1246 5\' 3"  (1.6 m)     Head Circumference --      Peak Flow --      Pain Score 02/06/18 1248  9     Pain Loc --      Pain Edu? --      Excl. in [place]? --     Constitutional: Alert and oriented. Well appearing and in no acute distress. Eyes: Conjunctivae are normal.  Head: Atraumatic. Nose: No congestion/rhinnorhea. Mouth/Throat: Mucous membranes are moist.  Neck: No stridor.   Cardiovascular: Normal rate, regular rhythm. Grossly normal heart sounds.   Respiratory: Normal respiratory effort.  No retractions. Lungs CTAB. Gastrointestinal: Soft with moderate tenderness across the lower abdomen. No distention.  Genitourinary: No lesions externally.  Speculum exam with a large amount  of yellow discharge.  Bimanual exam without CMT.  No uterine tenderness no adnexal tenderness to palpation. Musculoskeletal: No lower extremity tenderness nor edema.  No joint effusions. Neurologic:  Normal speech and language. No gross focal neurologic deficits are appreciated. Skin:  Skin is warm, dry and intact. No rash noted. Psychiatric: Mood and affect are normal. Speech and behavior are normal.  ____________________________________________   LABS (all labs ordered are listed, but only abnormal results are displayed)  Labs Reviewed  COMPREHENSIVE METABOLIC PANEL - Abnormal; Notable for the following components:      Result Value   Sodium 133 (*)    Glucose, Bld 103 (*)    BUN <5 (*)    Total Protein 8.9 (*)    Total Bilirubin 1.4 (*)    All other components within normal limits  CBC WITH DIFFERENTIAL/PLATELET - Abnormal; Notable for the following components:   WBC 15.4 (*)    Hemoglobin 9.9 (*)    HCT 31.0 (*)    MCV 75.0 (*)    MCH 23.9 (*)    MCHC 31.9 (*)    RDW 17.5 (*)    Neutro Abs 13.8 (*)    All other components within normal limits  URINALYSIS, COMPLETE (UACMP) WITH MICROSCOPIC - Abnormal; Notable for the following components:   Color, Urine STRAW (*)    APPearance CLEAR (*)    Specific Gravity, Urine 1.004 (*)    Hgb urine dipstick SMALL (*)    Leukocytes, UA MODERATE (*)    All other components within normal limits  URINE CULTURE  CULTURE, BLOOD (ROUTINE X 2)  CULTURE, BLOOD (ROUTINE X 2)  LACTIC ACID, PLASMA  LACTIC ACID, PLASMA  POCT PREGNANCY, URINE  POC URINE PREG, ED   ____________________________________________  EKG   ____________________________________________  RADIOLOGY  No acute finding on the patient's CT the abdomen and pelvis.  Normal appendix.  Fibroid uterus. ____________________________________________   PROCEDURES  Procedure(s) performed:   Procedures  Critical Care performed:    ____________________________________________   INITIAL IMPRESSION / ASSESSMENT AND PLAN / ED COURSE  Pertinent labs & imaging results that were available during my care of the patient were reviewed by me and considered in my medical decision making (see chart for details).  Differential diagnosis includes, but is not limited to, ovarian cyst, ovarian torsion, acute appendicitis, diverticulitis, urinary tract infection/pyelonephritis, endometriosis, bowel obstruction, colitis, renal colic, gastroenteritis, hernia, fibroids, endometriosis, pregnancy related pain including ectopic pregnancy, etc. As part of my medical decision making, I reviewed the following data within the [place] ER visits.  ----------------------------------------- 5:26 PM on 02/06/2018 -----------------------------------------  Patient with likely septic PID.  Patient will be admitted to OB/GYN.  Discussed case with Dr. Leafy Ro who will be admitting the patient.  Patient aware of need for admission and willing to comply. ____________________________________________   FINAL CLINICAL IMPRESSION(S) / ED DIAGNOSES  Sepsis.  PID.    NEW MEDICATIONS STARTED DURING THIS VISIT:  New Prescriptions   No medications on file     Note:  This document was prepared using Dragon voice recognition software and may include unintentional dictation errors.     Orbie Pyo, MD 02/06/18 [phone]

## 2018-02-07 LAB — CBC
HCT: 27.4 % — ABNORMAL LOW (ref 35.0–47.0)
HEMOGLOBIN: 8.7 g/dL — AB (ref 12.0–16.0)
MCH: 24.2 pg — ABNORMAL LOW (ref 26.0–34.0)
MCHC: 31.7 g/dL — AB (ref 32.0–36.0)
MCV: 76.5 fL — ABNORMAL LOW (ref 80.0–100.0)
Platelets: 249 10*3/uL (ref 150–440)
RBC: 3.59 MIL/uL — AB (ref 3.80–5.20)
RDW: 18 % — ABNORMAL HIGH (ref 11.5–14.5)
WBC: 11.1 10*3/uL — ABNORMAL HIGH (ref 3.6–11.0)

## 2018-02-07 LAB — BASIC METABOLIC PANEL
Anion gap: 5 (ref 5–15)
BUN: 8 mg/dL (ref 6–20)
CALCIUM: 9.1 mg/dL (ref 8.9–10.3)
CHLORIDE: 107 mmol/L (ref 101–111)
CO2: 27 mmol/L (ref 22–32)
CREATININE: 0.66 mg/dL (ref 0.44–1.00)
Glucose, Bld: 110 mg/dL — ABNORMAL HIGH (ref 65–99)
Potassium: 3.4 mmol/L — ABNORMAL LOW (ref 3.5–5.1)
SODIUM: 139 mmol/L (ref 135–145)

## 2018-02-07 LAB — URINE CULTURE: Culture: <10000 — AB

## 2018-02-07 MED ORDER — AZITHROMYCIN 250 MG PO TABS
1000.0000 mg | ORAL_TABLET | Freq: Once | ORAL | Status: AC
  Administered 2018-02-07: 1000 mg via ORAL
  Filled 2018-02-07: qty 4

## 2018-02-07 NOTE — Progress Notes (Signed)
Pt requested pain medication. Witnessed of 0.6mg  Hydromorphone waste with Manuela Schwartz, Therapist, sports. Pt ordered dose 0.4mg  Hydromorphone.  Witness documented in pyxis for the dose/waste provided.  Pharmacy notified upon attempt to document removal of vial and verify the dose, the count was rechecked for a total count of 1. One vial was left in the pyxis and only one vial was removed for that dose which was given to patient. Pharmacy notified that a discreprency was in pyxis. Spoke with pharmacy that removal was cleared up and no additional vials were removed. discreprency was cleared.

## 2018-02-07 NOTE — Plan of Care (Signed)
Vs stable; up ad lib and voiding well; has had IV toradol and iv dilaudid for pain control; tolerating regular diet

## 2018-02-07 NOTE — Progress Notes (Signed)
Subjective: Patient reports continued pelvic pain   + GC  HD#2 doxycycline and cefoxitin for tx  .    Objective: I have reviewed patient's vital signs and labs.  General: alert and cooperative Resp: clear to auscultation bilaterally Cardio: regular rate and rhythm, S1, S2 normal, no murmur, click, rub or gallop GI: + TTP , no rebound    Assessment/Plan: Cont ABX  Test for other STD   I have instructed her to contact her partner so that he may receive [place]   LOS: 0 days    Gwen Her Schermerhorn 02/07/2018, 3:22 PM

## 2018-02-07 NOTE — Progress Notes (Signed)
Valeta Harms contacted to notify of positive GC result in order to complete process of Health Department notification.  Valeta Harms states that lab is now responsible for this report to [facility].  Lab was called to be sure they were aware.  Lab states that they will notify the Health Department. Reed Breech, RN 02/07/2018 8:11 PM

## 2018-02-08 LAB — CBC
HEMATOCRIT: 25.8 % — AB (ref 35.0–47.0)
Hemoglobin: 8.2 g/dL — ABNORMAL LOW (ref 12.0–16.0)
MCH: 24.3 pg — ABNORMAL LOW (ref 26.0–34.0)
MCHC: 32 g/dL (ref 32.0–36.0)
MCV: 76 fL — AB (ref 80.0–100.0)
Platelets: 258 10*3/uL (ref 150–440)
RBC: 3.39 MIL/uL — ABNORMAL LOW (ref 3.80–5.20)
RDW: 17.6 % — ABNORMAL HIGH (ref 11.5–14.5)
WBC: 8.2 10*3/uL (ref 3.6–11.0)

## 2018-02-08 LAB — HIV ANTIBODY (ROUTINE TESTING W REFLEX): HIV SCREEN 4TH GENERATION: NONREACTIVE

## 2018-02-08 LAB — HEPATITIS C ANTIBODY: HCV Ab: <0.1 s/co ratio (ref 0.0–0.9)

## 2018-02-08 LAB — HEPATITIS B SURFACE ANTIBODY, QUANTITATIVE: HEPATITIS B-POST: 244.2 m[IU]/mL

## 2018-02-08 LAB — RPR: RPR: NONREACTIVE

## 2018-02-08 MED ORDER — CEFTRIAXONE SODIUM 250 MG IJ SOLR
250.0000 mg | Freq: Once | INTRAMUSCULAR | Status: AC
  Administered 2018-02-08: 250 mg via INTRAMUSCULAR
  Filled 2018-02-08 (×2): qty 250

## 2018-02-08 MED ORDER — ACETAMINOPHEN 325 MG PO TABS
650.0000 mg | ORAL_TABLET | Freq: Four times a day (QID) | ORAL | Status: DC | PRN
  Administered 2018-02-08: 650 mg via ORAL
  Filled 2018-02-08: qty 2

## 2018-02-08 MED ORDER — FERROUS SULFATE 325 (65 FE) MG PO TABS: 325.0000 mg | ORAL_TABLET | Freq: Two times a day (BID) | ORAL | 3 refills | Status: DC

## 2018-02-08 MED ORDER — ACETAMINOPHEN 325 MG PO TABS: 650.0000 mg | ORAL_TABLET | Freq: Four times a day (QID) | ORAL | 0 refills | Status: DC | PRN

## 2018-02-08 MED ORDER — DOXYCYCLINE HYCLATE 100 MG PO TABS: 100.0000 mg | ORAL_TABLET | Freq: Two times a day (BID) | ORAL | 0 refills | Status: AC

## 2018-02-08 MED ORDER — POLYETHYLENE GLYCOL 3350 17 G PO PACK: 17.0000 g | PACK | Freq: Every day | ORAL | 0 refills | Status: DC | PRN

## 2018-02-08 MED ORDER — IBUPROFEN 600 MG PO TABS
600.0000 mg | ORAL_TABLET | Freq: Four times a day (QID) | ORAL | Status: DC | PRN
  Administered 2018-02-08: 600 mg via ORAL
  Filled 2018-02-08: qty 1

## 2018-02-08 MED ORDER — POLYETHYLENE GLYCOL 3350 17 G PO PACK
17.0000 g | PACK | Freq: Every day | ORAL | Status: DC
  Filled 2018-02-08: qty 1

## 2018-02-08 MED ORDER — DOCUSATE SODIUM 100 MG PO CAPS: 100.0000 mg | ORAL_CAPSULE | Freq: Every day | ORAL | 0 refills | Status: DC | PRN

## 2018-02-08 MED ORDER — IBUPROFEN 600 MG PO TABS: 600.0000 mg | ORAL_TABLET | Freq: Four times a day (QID) | ORAL | 0 refills | Status: DC | PRN

## 2018-02-08 MED ORDER — METRONIDAZOLE 500 MG PO TABS: 500.0000 mg | ORAL_TABLET | Freq: Two times a day (BID) | ORAL | 0 refills | Status: AC

## 2018-02-08 NOTE — Progress Notes (Signed)
Patient discharged home. Discharge instructions, prescriptions and follow up appointment given to and reviewed with patient. Patient verbalized understanding.

## 2018-02-08 NOTE — Discharge Summary (Addendum)
Gynecologic Discharge Summary   Patient Name: Sue Hall DOB: 1985/11/12 MRN: [id number]  Date of Admission: 02/06/2018 Date of Discharge: 02/08/2018  Admitting Diagnosis: lower abdominal pain Secondary Diagnoses: Patient Active Problem List   Diagnosis Date Noted  . Pelvic inflammatory disease 02/06/2018   Objective  Objective:   Vitals:   02/07/18 1651 02/07/18 1938 02/08/18 0454 02/08/18 0806  BP: (!) 141/92 127/82 135/82 133/68  Pulse: (!) 103 93 94 77  Resp: 20 18 18 18   Temp: 99.8 F (37.7 C) 98.3 F (36.8 C) 98.7 F (37.1 C) 98.4 F (36.9 C)  TempSrc: Oral Oral Oral Oral  SpO2:  96% 97% 99%  Weight:      Height:       Temp:  [98.3 F (36.8 C)-99.8 F (37.7 C)] 98.4 F (36.9 C) (05/07 0806) Pulse Rate:  [77-103] 77 (05/07 0806) Resp:  [18-20] 18 (05/07 0806) BP: (127-141)/(68-92) 133/68 (05/07 0806) SpO2:  [96 %-99 %] 99 % (05/07 0806)  Labs:  CBC Latest Ref Rng & Units 02/08/2018 02/07/2018 02/06/2018  WBC 3.6 - 11.0 K/uL 8.2 11.1(H) 15.4(H)  Hemoglobin 12.0 - 16.0 g/dL 8.2(L) 8.7(L) 9.9(L)  Hematocrit 35.0 - 47.0 % 25.8(L) 27.4(L) 31.0(L)  Platelets 150 - 440 K/uL 258 249 289    Results for orders placed or performed during the hospital encounter of 02/06/18 (from the past 24 hour(s))  RPR     Status: None   Collection Time: 02/07/18  4:19 PM  Result Value Ref Range   RPR Ser Ql Non Reactive Non Reactive  HIV antibody     Status: None   Collection Time: 02/07/18  4:19 PM  Result Value Ref Range   HIV Screen 4th Generation wRfx Non Reactive Non Reactive  Hepatitis B surface antibody     Status: None   Collection Time: 02/07/18  4:19 PM  Result Value Ref Range   Hepatitis B-Post 244.2 Immunity>9.9 mIU/mL  Hepatitis C antibody     Status: None   Collection Time: 02/07/18  4:19 PM  Result Value Ref Range   HCV Ab <0.1 0.0 - 0.9 s/co ratio    Cultures: Results for orders placed or performed during the hospital encounter of 02/06/18  Urine Culture      Status: Abnormal   Collection Time: 02/06/18 12:59 PM  Result Value Ref Range Status   Specimen Description   Final    URINE, RANDOM Performed at [facility], [address], [place], [place] [address]    Special Requests   Final    NONE Performed at [facility], [address], [place], [place] [address]    Culture (A)  Final    <10,000 COLONIES/mL INSIGNIFICANT GROWTH Performed at [facility], [place] [address], [place], [place] [address]    Report Status 02/07/2018 FINAL  Final  Blood Culture (routine x 2)     Status: None (Preliminary result)   Collection Time: 02/06/18  2:09 PM  Result Value Ref Range Status   Specimen Description BLOOD LEFT ANTECUBITAL  Final   Special Requests   Final    BOTTLES DRAWN AEROBIC AND ANAEROBIC Blood Culture adequate volume   Culture   Final    NO GROWTH 2 DAYS Performed at [facility], [address], [place], [place] [address]    Report Status PENDING  Incomplete  Blood Culture (routine x 2)     Status: None (Preliminary result)   Collection Time: 02/06/18  2:09 PM  Result Value Ref Range Status   Specimen Description BLOOD RIGHT ANTECUBITAL  Final   Special Requests   Final    BOTTLES DRAWN AEROBIC AND ANAEROBIC Blood Culture adequate volume   Culture   Final    NO GROWTH 2 DAYS Performed at [facility], [address] [place], [place] [address]    Report Status PENDING  Incomplete  Wet prep, genital     Status: Abnormal   Collection Time: 02/06/18  4:17 PM  Result Value Ref Range Status   Yeast Wet Prep HPF POC NONE SEEN NONE SEEN Final   Trich, Wet Prep NONE SEEN NONE SEEN Final   Clue Cells Wet Prep HPF POC PRESENT (A) NONE SEEN Final   WBC, Wet Prep HPF POC MANY (A) NONE SEEN Final   Sperm NONE SEEN  Final    Comment: Performed at [facility], [address] [place], [place] [address]  [place] rt PCR [facility] only)     Status: Abnormal   Collection Time:  02/06/18  4:17 PM  Result Value Ref Range Status   Specimen source GC/Chlam URINE, RANDOM  Final   Chlamydia Tr NOT DETECTED NOT DETECTED Final   N gonorrhoeae DETECTED (A) NOT DETECTED Final    Comment: (NOTE) 100  This methodology has not been evaluated in pregnant women or in 200  patients with a history of hysterectomy. 300 400  This methodology will not be performed on patients less than 42  years of age. Performed at [facility], [address] [place], [place] [address]     Imaging: Ct Abdomen Pelvis W Contrast  Result Date: 02/06/2018 CLINICAL DATA:  32 year old female with history of lower abdominal pain for the past 2 days. Nausea. EXAM: CT ABDOMEN AND PELVIS WITH CONTRAST TECHNIQUE: Multidetector CT imaging of the abdomen and pelvis was performed using the standard protocol following bolus administration of intravenous contrast. CONTRAST:  73mL ISOVUE-370 IOPAMIDOL (ISOVUE-370) INJECTION 76% COMPARISON:  No priors. FINDINGS: Lower chest: Unremarkable. Hepatobiliary: No suspicious cystic or solid hepatic lesions. No intra or extrahepatic biliary ductal dilatation. Gallbladder is normal in appearance. Pancreas: No pancreatic mass. No pancreatic ductal dilatation. No pancreatic or peripancreatic fluid or inflammatory changes. Spleen: Unremarkable. Adrenals/Urinary Tract: Bilateral kidneys and bilateral adrenal glands are normal in appearance. No hydroureteronephrosis. Urinary bladder is normal in appearance. Stomach/Bowel: Normal appearance of the stomach. No pathologic dilatation of small bowel or colon. Normal appendix (retrocecal in location). Vascular/Lymphatic: No significant atherosclerotic disease, aneurysm or dissection noted in the abdominal or pelvic vasculature. No lymphadenopathy noted in the abdomen or pelvis. Reproductive: Several small uterine lesions are noted is a heterogeneously, the largest of which enhancing lesion in the posterior aspect of the uterine body  measuring 2.6 cm in diameter (sagittal image 56 of series 6), most compatible with small fibroids. Ovaries are unremarkable in appearance. Other: No significant volume of ascites.  No pneumoperitoneum. Musculoskeletal: There are no aggressive appearing lytic or blastic lesions noted in the visualized portions of the skeleton. IMPRESSION: 1. No acute findings are noted in the abdomen or pelvis to account for the patient's symptoms. 2. Normal appendix. 3. Fibroid uterus. Electronically Signed   By: Vinnie Langton M.D.   On: 02/06/2018 15:13    Physical exam:  BP 111/86 (BP Location: Left Arm)   Pulse 77   Temp 97.9 F (36.6 C) (Oral)   Resp 18   Ht 5\' 3"  (1.6 m)   Wt 79.4 kg (175 lb)  LMP 02/02/2018 (Within Days)   SpO2 99%   BMI 31.00 kg/m  General: alert and no distress Cardiovascular: RRR, no murmurs Pulmonary: CTAB, normal respiratory effort Abdomen: mild distension, mild b/l LQ tenderness, normoactive bowel sounds, no guarding Extremities: No erythema or cords, no calf tenderness, with normal peripheral pulses.   Hospital Course  Patient was admitted with b/l lower abdominal pain and fever. She was diagnosed with BV, gonorrhea, and PID. She was treated with IV antibiotics and pain medication. All other STI testing, blood cultures, and urine cultures were negative. By time of discharge on HD#3, her WBC had normalized, her pain was controlled on oral pain medications, she was ambulating, voiding without difficulty, and tolerating regular diet.  She was deemed stable for discharge to home.       Plan:  MALEYA LEEVER was discharged to home in good condition. Discussed plan of care with Dr. Laverta Baltimore, who is in agreement with this plan.   1. Gonorrhea: Azithromycin 1g PO and Ceftriaxone 250mg  IM given prior to discharge 2. BV: IV metronidazole given, will convert to PO treatment as part of PID regimen 3. PID: Convert IV treatment to Doxycycline 100mg  PO BID x 12 days  and Metronidazole 500mg  PO BID x 12 days 4. Constipation: Advised adequate hydration and dietary fiber. Discussed stool softeners and laxatives as needed, particularly with iron supplement. Prescriptions for Colace and Miralax sent to pharmacy.  5. Anemia: Ferrous sulfate PO BID prescribed and sent to pharmacy.   Discharge Instructions: Per After Visit Summary. Activity: Advance as tolerated. Diet: Regular Discharge Medications: Allergies as of 02/08/2018   No Known Allergies     Medication List    TAKE these medications   acetaminophen 325 MG tablet Commonly known as:  TYLENOL Take 2 tablets (650 mg total) by mouth every 6 (six) hours as needed for mild pain or moderate pain.   docusate sodium 100 MG capsule Commonly known as:  COLACE Take 1 capsule (100 mg total) by mouth daily as needed for mild constipation.   doxycycline 100 MG tablet Commonly known as:  VIBRA-TABS Take 1 tablet (100 mg total) by mouth 2 (two) times daily for 12 days.   ferrous sulfate 325 (65 FE) MG tablet Take 1 tablet (325 mg total) by mouth 2 (two) times daily with a meal.   ibuprofen 600 MG tablet Commonly known as:  ADVIL,MOTRIN Take 1 tablet (600 mg total) by mouth every 6 (six) hours as needed for mild pain or moderate pain.   metroNIDAZOLE 500 MG tablet Commonly known as:  FLAGYL Take 1 tablet (500 mg total) by mouth 2 (two) times daily for 12 days.   polyethylene glycol packet Commonly known as:  MIRALAX / GLYCOLAX Take 17 g by mouth daily as needed for mild constipation or moderate constipation.   prenatal multivitamin Tabs tablet Take 1 tablet by mouth daily at 12 noon.      Outpatient follow up:  Follow-up [facility] Follow up in 2 week(s).   Why:  Follow-up after completion of antibiotics or as needed if symptoms do not improve.  Contact information: [place] [phone]           Signed:  Lisette Grinder, CNM 02/08/2018 12:33 PM

## 2018-02-11 LAB — CULTURE, BLOOD (ROUTINE X 2)
Culture: NO GROWTH
Culture: NO GROWTH
Special Requests: ADEQUATE
Special Requests: ADEQUATE

## 2018-05-23 ENCOUNTER — Encounter: Payer: Self-pay | Admitting: Emergency Medicine

## 2018-05-23 ENCOUNTER — Emergency Department
Admission: EM | Admit: 2018-05-23 | Discharge: 2018-05-23 | Disposition: A | Discharge status: Home / Self Care | Payer: Self-pay | Attending: Emergency Medicine | Admitting: Emergency Medicine

## 2018-05-23 ENCOUNTER — Other Ambulatory Visit: Payer: Self-pay

## 2018-05-23 DIAGNOSIS — Z79899 Other long term (current) drug therapy: Secondary | ICD-10-CM | POA: Insufficient documentation

## 2018-05-23 DIAGNOSIS — I10 Essential (primary) hypertension: Secondary | ICD-10-CM | POA: Insufficient documentation

## 2018-05-23 DIAGNOSIS — R112 Nausea with vomiting, unspecified: Secondary | ICD-10-CM | POA: Insufficient documentation

## 2018-05-23 DIAGNOSIS — F10929 Alcohol use, unspecified with intoxication, unspecified: Secondary | ICD-10-CM | POA: Insufficient documentation

## 2018-05-23 DIAGNOSIS — F17210 Nicotine dependence, cigarettes, uncomplicated: Secondary | ICD-10-CM | POA: Insufficient documentation

## 2018-05-23 MED ORDER — ONDANSETRON HCL 4 MG PO TABS: 4.0000 mg | ORAL_TABLET | Freq: Every day | ORAL | 0 refills | Status: DC | PRN

## 2018-05-23 MED ORDER — ONDANSETRON HCL 4 MG/2ML IJ SOLN
4.0000 mg | Freq: Once | INTRAMUSCULAR | Status: AC
  Administered 2018-05-23: 4 mg via INTRAVENOUS
  Filled 2018-05-23: qty 2

## 2018-05-23 NOTE — ED Provider Notes (Signed)
[facility] Emergency Department Provider Note  ____________________________________________   First MD Initiated Contact with Patient 05/23/18 [id number]     (approximate)  I have reviewed the triage vital signs and the nursing notes.   HISTORY  Chief Complaint Alcohol Intoxication and Loss of Consciousness  5 exemption history limited by the patient's clinical condition  HPI Sue Hall is a 32 y.o. female who comes to the emergency department via EMS after syncopal episode while drinking alcohol.  The patient was at her sister's house drinking heavily when she collapsed  to the ground.  No seizure-like activity.  When EMS arrived they administered an ammonia inhalant and 4 mg of Narcan because the patient had "small pupils" with minimal relief.  The patient herself says she had 5 mixed drinks that she did not make and does not know what might of been in them.   Past Medical History:  Diagnosis Date  . Hypertension     Patient Active Problem List   Diagnosis Date Noted  . Pelvic inflammatory disease 02/06/2018    Past Surgical History:  Procedure Laterality Date  . ECTOPIC PREGNANCY SURGERY Right     Prior to Admission medications   Medication Sig Start Date End Date Taking? Authorizing Provider  acetaminophen (TYLENOL) 325 MG tablet Take 2 tablets (650 mg total) by mouth every 6 (six) hours as needed for mild pain or moderate pain. 02/08/18   Lisette Grinder, CNM  docusate sodium (COLACE) 100 MG capsule Take 1 capsule (100 mg total) by mouth daily as needed for mild constipation. 02/08/18   Lisette Grinder, CNM  ferrous sulfate 325 (65 FE) MG tablet Take 1 tablet (325 mg total) by mouth 2 (two) times daily with a meal. 02/08/18 02/08/19  Lisette Grinder, CNM  ibuprofen (ADVIL,MOTRIN) 600 MG tablet Take 1 tablet (600 mg total) by mouth every 6 (six) hours as needed for mild pain or moderate pain. 02/08/18   Lisette Grinder, CNM  ondansetron  (ZOFRAN) 4 MG tablet Take 1 tablet (4 mg total) by mouth daily as needed. 05/23/18 05/23/19  Darel Hong, MD  polyethylene glycol (MIRALAX / Floria Raveling) packet Take 17 g by mouth daily as needed for mild constipation or moderate constipation. 02/08/18   Lisette Grinder, CNM  Prenatal Vit-Fe Fumarate-FA (PRENATAL MULTIVITAMIN) TABS tablet Take 1 tablet by mouth daily at 12 noon.    [provider]    Allergies Patient has no known allergies.  History reviewed. No pertinent family history.  Social History Social History   Tobacco Use  . Smoking status: Current Every Day Smoker    Packs/day: 0.50    Types: Cigarettes  . Smokeless tobacco: Never Used  Substance Use Topics  . Alcohol use: Yes  . Drug use: No    Review of Systems Level 5 exemption history limited by the patient's intoxication  ____________________________________________   PHYSICAL EXAM:  VITAL SIGNS: ED Triage Vitals  Enc Vitals Group     BP      Pulse      Resp      Temp      Temp src      SpO2      Weight      Height      Head Circumference      Peak Flow      Pain Score      Pain Loc      Pain Edu?      Excl. in [place]?  Constitutional: Heavily intoxicated slurring her speech Eyes: PERRL EOMI. head range and brisk Head: Atraumatic. Nose: No congestion/rhinnorhea. Mouth/Throat: No trismus Neck: No stridor.  No meningismus Cardiovascular: Normal rate, regular rhythm. Grossly normal heart sounds.  Good peripheral circulation. Respiratory: Slightly decreased respiratory effort.  No retractions. Lungs CTAB and moving good air Gastrointestinal: Soft nontender Musculoskeletal: No lower extremity edema   Neurologic:  . No gross focal neurologic deficits are appreciated. Skin:  Skin is warm, dry and intact. No rash noted. Psychiatric: Heavily intoxicated   ____________________________________________   DIFFERENTIAL includes but not limited to  Alcohol intoxication, opiate overdose,  benzodiazepine overdose, metabolic derangement, withdrawal syndrome, meningitis ____________________________________________   LABS (all labs ordered are listed, but only abnormal results are displayed)  Labs Reviewed - No data to display   __________________________________________  EKG   ____________________________________________  RADIOLOGY   ____________________________________________   PROCEDURES  Procedure(s) performed: no  Procedures  Critical Care performed: no  ____________________________________________   INITIAL IMPRESSION / ASSESSMENT AND PLAN / ED COURSE  Pertinent labs & imaging results that were available during my care of the patient were reviewed by me and considered in my medical decision making (see chart for details).   As part of my medical decision making, I reviewed the following data within the [place] History obtained from family if available, nursing notes, old chart and ekg, as well as notes from prior ED visits.  The patient is able to wake up and give a clear history of drinking at least 5 alcoholic beverages today.  Her family is at bedside on arrival and the patient declines further evaluation which I think is actually reasonable.  She is able to drink some water here and I will discharge her home with Zofran for home.  Strict return precautions have been given.      ____________________________________________   FINAL CLINICAL IMPRESSION(S) / ED DIAGNOSES  Final diagnoses:  Alcoholic intoxication with complication (HCC)  Non-intractable vomiting with nausea, unspecified vomiting type      NEW MEDICATIONS STARTED DURING THIS VISIT:  Discharge Medication List as of 05/23/2018  3:41 AM    START taking these medications   Details  ondansetron (ZOFRAN) 4 MG tablet Take 1 tablet (4 mg total) by mouth daily as needed., Starting Mon 05/23/2018, Until Tue 05/23/2019, Print         Note:  This document was  prepared using Dragon voice recognition software and may include unintentional dictation errors.     Darel Hong, MD 05/27/18 [phone]

## 2018-05-23 NOTE — ED Notes (Signed)
Peripheral IV discontinued. Catheter intact. No signs of infiltration or redness. Gauze applied to IV site.   DC'd with mother, wheeled to vehicle  Discharge instructions reviewed with patient. Questions fielded by this RN. Patient verbalizes understanding of instructions. Patient discharged home in stable condition per rifenbark. No acute distress noted at time of discharge.

## 2018-05-23 NOTE — ED Triage Notes (Signed)
Patient presents to Emergency Department via EMS with complaints of ETOH intoxication, and loss of consciousness.  Pt was at sisters house drinking at 7pm, LOC noticed approx 1 hour ago and EMS was called. \  Pt was found unresponsive, ammonia inhalent and 4 of narcan was given d/t pinpoint pupils.  Pt became responsive and only recalls 5 mixed drinks that pt didn't make.  Pt alert and appears intoxicated.

## 2018-05-23 NOTE — ED Notes (Signed)
Water given  

## 2018-05-23 NOTE — ED Notes (Signed)
Family at bedside. 

## 2018-05-23 NOTE — Discharge Instructions (Signed)
It was a pleasure to take care of you today, and thank you for coming to our emergency department.  If you have any questions or concerns before leaving please ask the nurse to grab me and I'm more than happy to go through your aftercare instructions again. ° °If you were prescribed any opioid pain medication today such as Norco, Vicodin, Percocet, morphine, hydrocodone, or oxycodone please make sure you do not drive when you are taking this medication as it can alter your ability to drive safely. ° °If you have any concerns once you are home that you are not improving or are in fact getting worse before you can make it to your follow-up appointment, please do not hesitate to call 911 and come back for further evaluation. ° °Perrin Eddleman, MD ° ° ° °

## 2018-07-13 ENCOUNTER — Encounter: Payer: Self-pay | Admitting: Emergency Medicine

## 2018-07-13 ENCOUNTER — Emergency Department
Admission: EM | Admit: 2018-07-13 | Discharge: 2018-07-13 | Disposition: A | Discharge status: Home / Self Care | Payer: Self-pay | Attending: Emergency Medicine | Admitting: Emergency Medicine

## 2018-07-13 ENCOUNTER — Other Ambulatory Visit: Payer: Self-pay

## 2018-07-13 DIAGNOSIS — R030 Elevated blood-pressure reading, without diagnosis of hypertension: Secondary | ICD-10-CM

## 2018-07-13 DIAGNOSIS — I10 Essential (primary) hypertension: Secondary | ICD-10-CM | POA: Insufficient documentation

## 2018-07-13 DIAGNOSIS — F17210 Nicotine dependence, cigarettes, uncomplicated: Secondary | ICD-10-CM | POA: Insufficient documentation

## 2018-07-13 DIAGNOSIS — J069 Acute upper respiratory infection, unspecified: Secondary | ICD-10-CM | POA: Insufficient documentation

## 2018-07-13 MED ORDER — BENZONATATE 100 MG PO CAPS: ORAL_CAPSULE | ORAL | 0 refills | Status: DC

## 2018-07-13 NOTE — ED Notes (Signed)
See triage note  Presents with cough for about 1 week  States cough has been non productive   But states cough became worse over the past couple of days  Afebrile on arrival

## 2018-07-13 NOTE — Discharge Instructions (Addendum)
Call make an appointment with 1 of the clinics listed on your discharge papers including [facility].  Your blood pressure needs to be rechecked as it was elevated both times that it was taken.  This indicates that you may need to be on blood pressure medication.  Begin taking Tessalon 1 or 2 every 8 hours as needed for cough.  Increase fluids.  Decrease or discontinue smoking.  You may also take Tylenol or ibuprofen if needed for body aches or throat pain.

## 2018-07-13 NOTE — ED Provider Notes (Signed)
[facility] Emergency Department Provider Note   ____________________________________________   First MD Initiated Contact with Patient 07/13/18 1016     (approximate)  I have reviewed the triage vital signs and the nursing notes.   HISTORY  Chief Complaint Cough   HPI Sue Hall is a 32 y.o. female presents to the ED with complaint of nonproductive cough for 1 week.  Patient denies any fever and chills.  She states that she has taken NyQuil a couple of times and begin taking Mucinex today.  She complains of sore throat and occasional sneezing.  She denies any exposure to strep throat.  Patient continues to smoke 1/2 pack cigarettes per day.   Past Medical History:  Diagnosis Date  . Hypertension     Patient Active Problem List   Diagnosis Date Noted  . Pelvic inflammatory disease 02/06/2018    Past Surgical History:  Procedure Laterality Date  . ECTOPIC PREGNANCY SURGERY Right     Prior to Admission medications   Medication Sig Start Date End Date Taking? Authorizing Provider  benzonatate (TESSALON PERLES) 100 MG capsule 1-2 tablets q 8 hours prn cough 07/13/18   Johnn Hai, PA-C    Allergies Patient has no known allergies.  No family history on file.  Social History Social History   Tobacco Use  . Smoking status: Current Every Day Smoker    Packs/day: 0.50    Types: Cigarettes  . Smokeless tobacco: Never Used  Substance Use Topics  . Alcohol use: Yes  . Drug use: No    Review of Systems Constitutional: No fever/chills Eyes: No visual changes. ENT: Positive sore throat.  Positive nasal congestion. Cardiovascular: Denies chest pain. Respiratory: Denies shortness of breath.  Positive nonproductive cough. Gastrointestinal:  No nausea, no vomiting.   Musculoskeletal: Negative for muscle aches. Skin: Negative for rash. Neurological: Negative for headaches, focal weakness or  numbness. ____________________________________________   PHYSICAL EXAM:  VITAL SIGNS: ED Triage Vitals  Enc Vitals Group     BP 07/13/18 1016 (!) 166/100     Pulse Rate 07/13/18 1016 100     Resp 07/13/18 1016 18     Temp 07/13/18 1016 98 F (36.7 C)     Temp Source 07/13/18 1016 Oral     SpO2 07/13/18 1016 100 %     Weight 07/13/18 1010 169 lb 5 oz (76.8 kg)     Height 07/13/18 1017 5\' 3"  (1.6 m)     Head Circumference --      Peak Flow --      Pain Score 07/13/18 1010 0     Pain Loc --      Pain Edu? --      Excl. in [place]? --    Constitutional: Alert and oriented. Well appearing and in no acute distress. Eyes: Conjunctivae are normal. Head: Atraumatic. Nose: Minimal congestion/rhinnorhea.  EACs and TMs are clear bilaterally. Mouth/Throat: Mucous membranes are moist.  Oropharynx non-erythematous.  No exudate, uvula is midline. Neck: No stridor.   Hematological/Lymphatic/Immunilogical: No cervical lymphadenopathy. Cardiovascular: Normal rate, regular rhythm. Grossly normal heart sounds.  Good peripheral circulation. Respiratory: Normal respiratory effort.  No retractions. Lungs CTAB. Musculoskeletal: Moves upper and lower extremities with any difficulty.  Normal gait was noted. Neurologic:  Normal speech and language. No gross focal neurologic deficits are appreciated. Skin:  Skin is warm, dry and intact. No rash noted. Psychiatric: Mood and affect are normal. Speech and behavior are normal.  ____________________________________________   LABS (  all labs ordered are listed, but only abnormal results are displayed)  Labs Reviewed - No data to display   PROCEDURES  Procedure(s) performed: None  Procedures  Critical Care performed: No  ____________________________________________   INITIAL IMPRESSION / ASSESSMENT AND PLAN / ED COURSE  As part of my medical decision making, I reviewed the following data within the electronic MEDICAL RECORD NUMBER Notes from prior ED  visits and [place] Controlled Substance Database  Patient presents to the ED with upper respiratory symptoms.  Initially her blood pressure was elevated and she denied history of hypertension.  She did tell the nurse however that she discontinued taking her blood pressure medication 10 years ago.  Physical exam is consistent with upper restaurant infection.  Prior to discharge her blood pressure was rechecked and was 162/96.  Patient denies any symptoms such as chest pain, shortness of breath or dizziness.  She is encouraged to follow-up with 1 of the clinics listed on her discharge papers for follow-up of her elevated blood pressure.  She was given a prescription for Tessalon 1 or 2 every 8 hours as needed for cough and to increase fluids as needed. ____________________________________________   FINAL CLINICAL IMPRESSION(S) / ED DIAGNOSES  Final diagnoses:  Upper respiratory tract infection, unspecified type  Elevated blood pressure reading     ED Discharge Orders         Ordered    benzonatate (TESSALON PERLES) 100 MG capsule     07/13/18 1043           Note:  This document was prepared using Dragon voice recognition software and may include unintentional dictation errors.    Johnn Hai, PA-C 07/13/18 1608    Earleen Newport, MD 07/14/18 [phone]

## 2018-07-13 NOTE — ED Triage Notes (Signed)
Cough since Saturday.  Denies fever.

## 2018-12-21 ENCOUNTER — Other Ambulatory Visit: Payer: Self-pay

## 2018-12-21 ENCOUNTER — Emergency Department
Admission: EM | Admit: 2018-12-21 | Discharge: 2018-12-21 | Disposition: A | Discharge status: Home / Self Care | Payer: Self-pay | Attending: Emergency Medicine | Admitting: Emergency Medicine

## 2018-12-21 DIAGNOSIS — A084 Viral intestinal infection, unspecified: Secondary | ICD-10-CM | POA: Insufficient documentation

## 2018-12-21 DIAGNOSIS — I10 Essential (primary) hypertension: Secondary | ICD-10-CM | POA: Insufficient documentation

## 2018-12-21 DIAGNOSIS — F17210 Nicotine dependence, cigarettes, uncomplicated: Secondary | ICD-10-CM | POA: Insufficient documentation

## 2018-12-21 DIAGNOSIS — E860 Dehydration: Secondary | ICD-10-CM | POA: Insufficient documentation

## 2018-12-21 LAB — URINALYSIS, COMPLETE (UACMP) WITH MICROSCOPIC
BACTERIA UA: NONE SEEN
Bilirubin Urine: NEGATIVE
Glucose, UA: NEGATIVE mg/dL
Hgb urine dipstick: NEGATIVE
KETONES UR: 20 mg/dL — AB
Leukocytes,Ua: NEGATIVE
Nitrite: NEGATIVE
PROTEIN: NEGATIVE mg/dL
Specific Gravity, Urine: 1.023 (ref 1.005–1.030)
pH: 6 (ref 5.0–8.0)

## 2018-12-21 LAB — CBC WITH DIFFERENTIAL/PLATELET
ABS IMMATURE GRANULOCYTES: 0.01 10*3/uL (ref 0.00–0.07)
Basophils Absolute: 0.1 10*3/uL (ref 0.0–0.1)
Basophils Relative: 1 %
Eosinophils Absolute: 0 10*3/uL (ref 0.0–0.5)
Eosinophils Relative: 0 %
HCT: 38.9 % (ref 36.0–46.0)
HEMOGLOBIN: 12.1 g/dL (ref 12.0–15.0)
IMMATURE GRANULOCYTES: 0 %
LYMPHS PCT: 23 %
Lymphs Abs: 2.1 10*3/uL (ref 0.7–4.0)
MCH: 25.6 pg — AB (ref 26.0–34.0)
MCHC: 31.1 g/dL (ref 30.0–36.0)
MCV: 82.2 fL (ref 80.0–100.0)
MONO ABS: 0.6 10*3/uL (ref 0.1–1.0)
Monocytes Relative: 6 %
NEUTROS ABS: 6.4 10*3/uL (ref 1.7–7.7)
NEUTROS PCT: 70 %
PLATELETS: 374 10*3/uL (ref 150–400)
RBC: 4.73 MIL/uL (ref 3.87–5.11)
RDW: 15.4 % (ref 11.5–15.5)
WBC: 9.1 10*3/uL (ref 4.0–10.5)
nRBC: 0 % (ref 0.0–0.2)

## 2018-12-21 LAB — COMPREHENSIVE METABOLIC PANEL
ALK PHOS: 74 U/L (ref 38–126)
ALT: 38 U/L (ref 0–44)
AST: 45 U/L — AB (ref 15–41)
Albumin: 4.5 g/dL (ref 3.5–5.0)
Anion gap: 10 (ref 5–15)
BUN: 9 mg/dL (ref 6–20)
CALCIUM: 10 mg/dL (ref 8.9–10.3)
CO2: 22 mmol/L (ref 22–32)
CREATININE: 0.7 mg/dL (ref 0.44–1.00)
Chloride: 104 mmol/L (ref 98–111)
GFR calc non Af Amer: >60 mL/min (ref >60)
GLUCOSE: 105 mg/dL — AB (ref 70–99)
Potassium: 3.5 mmol/L (ref 3.5–5.1)
Sodium: 136 mmol/L (ref 135–145)
Total Bilirubin: 2 mg/dL — ABNORMAL HIGH (ref 0.3–1.2)
Total Protein: 8.5 g/dL — ABNORMAL HIGH (ref 6.5–8.1)

## 2018-12-21 LAB — INFLUENZA PANEL BY PCR (TYPE A & B)
INFLBPCR: NEGATIVE
Influenza A By PCR: NEGATIVE

## 2018-12-21 LAB — POCT PREGNANCY, URINE: PREG TEST UR: NEGATIVE

## 2018-12-21 MED ORDER — ONDANSETRON HCL 8 MG PO TABS: 8.0000 mg | ORAL_TABLET | Freq: Three times a day (TID) | ORAL | 0 refills | Status: DC | PRN

## 2018-12-21 MED ORDER — ONDANSETRON HCL 4 MG/2ML IJ SOLN
4.0000 mg | Freq: Once | INTRAMUSCULAR | Status: AC
Start: 2018-12-21 — End: 2018-12-21
  Administered 2018-12-21: 4 mg via INTRAVENOUS
  Filled 2018-12-21: qty 2

## 2018-12-21 MED ORDER — SODIUM CHLORIDE 0.9 % IV BOLUS
1000.0000 mL | Freq: Once | INTRAVENOUS | Status: AC
  Administered 2018-12-21: 1000 mL via INTRAVENOUS

## 2018-12-21 NOTE — ED Provider Notes (Signed)
[facility] Emergency Department Provider Note   ____________________________________________   First MD Initiated Contact with Patient 12/21/18 [phone]     (approximate)  I have reviewed the triage vital signs and the nursing notes.   HISTORY  Chief Complaint Nausea    HPI Sue Hall is a 33 y.o. female patient complain of nausea and vomiting for 2 days.  Patient state has vomited 6-7 times in the past 24 hours.  Patient also complained of vertigo and shakes.  Patient denies recent travel.  Patient denies any exposure to anybody has been sick.  Patient denies chest or abdominal pain.  Patient had taken a flu shot for this season.  No palliative measure for complaint.  Patient is aware she has hypertension but has not follow-up as directed by her multiple doctors.         Past Medical History:  Diagnosis Date  . Hypertension     Patient Active Problem List   Diagnosis Date Noted  . Pelvic inflammatory disease 02/06/2018    Past Surgical History:  Procedure Laterality Date  . ECTOPIC PREGNANCY SURGERY Right     Prior to Admission medications   Medication Sig Start Date End Date Taking? Authorizing Provider  benzonatate (TESSALON PERLES) 100 MG capsule 1-2 tablets q 8 hours prn cough 07/13/18   Letitia Neri L, PA-C  ondansetron (ZOFRAN) 8 MG tablet Take 1 tablet (8 mg total) by mouth every 8 (eight) hours as needed for nausea or vomiting. 12/21/18   Sable Feil, PA-C    Allergies Patient has no known allergies.  No family history on file.  Social History Social History   Tobacco Use  . Smoking status: Current Every Day Smoker    Packs/day: 0.50    Types: Cigarettes  . Smokeless tobacco: Never Used  Substance Use Topics  . Alcohol use: Yes  . Drug use: No    Review of Systems Constitutional: No fever/chills Eyes: No visual changes. ENT: No sore throat. Cardiovascular: Denies chest pain. Respiratory: Denies  shortness of breath. Gastrointestinal: No abdominal pain.  Nausea and vomiting.  No diarrhea.  No constipation. Genitourinary: Negative for dysuria. Musculoskeletal: Negative for back pain. Skin: Negative for rash. Neurological: Negative for headaches, focal weakness or numbness.  Mild vertigo.   Endocrine:  Hypertension ____________________________________________   PHYSICAL EXAM:  VITAL SIGNS: ED Triage Vitals [12/21/18 0848]  Enc Vitals Group     BP (!) 172/102     Pulse Rate (!) 105     Resp 16     Temp 98.7 F (37.1 C)     Temp Source Oral     SpO2 100 %     Weight 170 lb (77.1 kg)     Height 5\' 3"  (1.6 m)     Head Circumference      Peak Flow      Pain Score 0     Pain Loc      Pain Edu?      Excl. in [place]?     Constitutional: Alert and oriented. Well appearing and in no acute distress. Eyes: Conjunctivae are normal. PERRL. EOMI. Head: Atraumatic. Nose: No congestion/rhinnorhea. Mouth/Throat: Mucous membranes are dry.  Oropharynx non-erythematous. Neck: No stridor.  No cervical spine tenderness to palpation. Hematological/Lymphatic/Immunilogical: No cervical lymphadenopathy. Cardiovascular: Normal rate, regular rhythm. Grossly normal heart sounds.  Good peripheral circulation.  Evaded blood pressure. Respiratory: Normal respiratory effort.  No retractions. Lungs CTAB. Gastrointestinal: Hyperactive bowel sounds.  Soft and nontender.  No distention. No abdominal bruits. No CVA tenderness. Musculoskeletal: No lower extremity tenderness nor edema.  No joint effusions. Neurologic:  Normal speech and language. No gross focal neurologic deficits are appreciated. No gait instability. Skin:  Skin is warm, dry and intact. No rash noted. Psychiatric: Mood and affect are normal. Speech and behavior are normal.  ____________________________________________   LABS (all labs ordered are listed, but only abnormal results are displayed)  Labs Reviewed  CBC WITH  DIFFERENTIAL/PLATELET - Abnormal; Notable for the following components:      Result Value   MCH 25.6 (*)    All other components within normal limits  COMPREHENSIVE METABOLIC PANEL - Abnormal; Notable for the following components:   Glucose, Bld 105 (*)    Total Protein 8.5 (*)    AST 45 (*)    Total Bilirubin 2.0 (*)    All other components within normal limits  URINALYSIS, COMPLETE (UACMP) WITH MICROSCOPIC - Abnormal; Notable for the following components:   Color, Urine YELLOW (*)    APPearance CLEAR (*)    Ketones, ur 20 (*)    All other components within normal limits  INFLUENZA PANEL BY PCR (TYPE A & B)  POC URINE PREG, ED  POCT PREGNANCY, URINE   ____________________________________________  EKG  EKG read by heart station Dr. with no acute findings. ____________________________________________  [place]  ED MD interpretation:   Official radiology report(s): No results found.  ____________________________________________   PROCEDURES  Procedure(s) performed (including Critical Care):  Procedures   ____________________________________________   INITIAL IMPRESSION / ASSESSMENT AND PLAN / ED COURSE  As part of my medical decision making, I reviewed the following data within the [place]         Patient presents with 2 days of nausea and vomiting.  Patient denies other URI signs and symptoms.  Denies diarrhea.  Patient was negative for influenza but her labs are consistent with dehydration.  Patient given IV hydration and Zofran which remarkably improved her complaint.  Patient given discharge care instruction work note.  Patient advised take medication as directed and [facility].      ____________________________________________   FINAL CLINICAL IMPRESSION(S) / ED DIAGNOSES  Final diagnoses:  Viral gastroenteritis  Dehydration     ED Discharge Orders         Ordered    ondansetron (ZOFRAN) 8 MG  tablet  Every 8 hours PRN     12/21/18 1215           Note:  This document was prepared using Dragon voice recognition software and may include unintentional dictation errors.    Sable Feil, PA-C 12/21/18 1217    Lavonia Drafts, MD 12/21/18 [phone]

## 2018-12-21 NOTE — ED Notes (Signed)
NAD noted at time of D/C. Pt denies questions or concerns. Pt ambulatory to the lobby at this time.  

## 2018-12-21 NOTE — ED Triage Notes (Signed)
Pt arrived via POV with nausea and emesis. Pt has vomitted 6-7 times in the past 24 hours. Pt also c/o of being dizzy and having shakes.

## 2018-12-26 IMAGING — CT CT ABD-PELV W/ CM
2 of 4 series · 16 of 46 positions shown, 18 images · IV contrast (APPLIED)
Comparison: No priors.

CLINICAL DATA: 32-year-old female with history of lower abdominal
pain for the past 2 days. Nausea.

EXAM:
CT ABDOMEN AND PELVIS WITH CONTRAST
TECHNIQUE: Multidetector CT imaging of the abdomen and pelvis was performed
using the standard protocol following bolus administration of
intravenous contrast.
CONTRAST:  75mL WWAC9Y-LB8 IOPAMIDOL (WWAC9Y-LB8) INJECTION 76%

[Series 2: routine abd/pel with · axial · 0.74mm/px · z∈[-474,-84]mm · 13 of 86 slices shown, 15 images]
[im 4/86  soft-tissue]
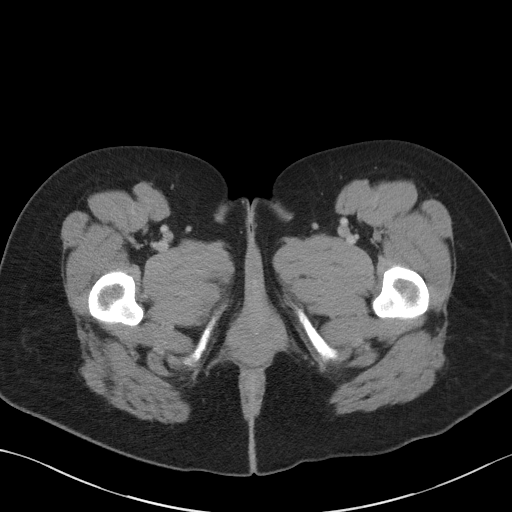
[im 4/86  bone]
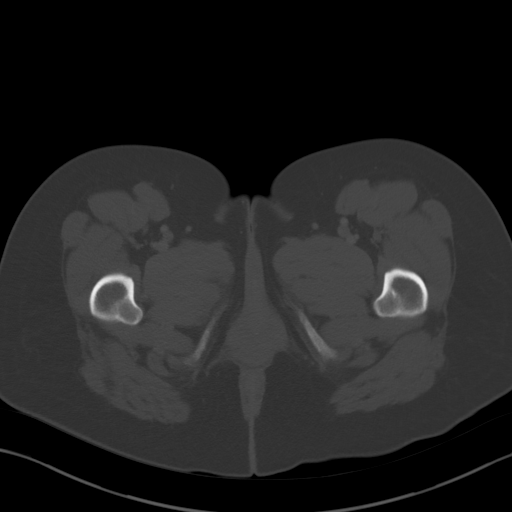
[im 11/86  soft-tissue]
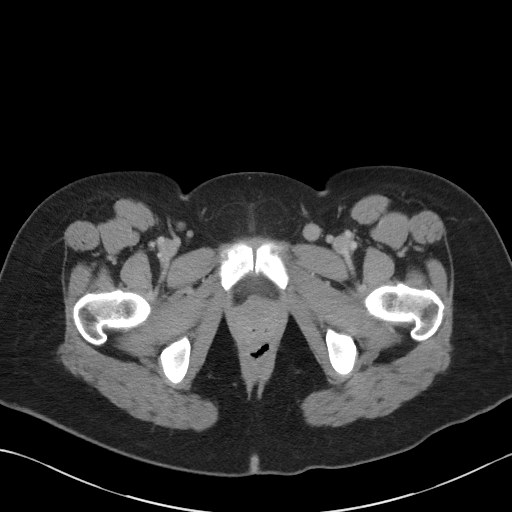
[im 18/86  soft-tissue]
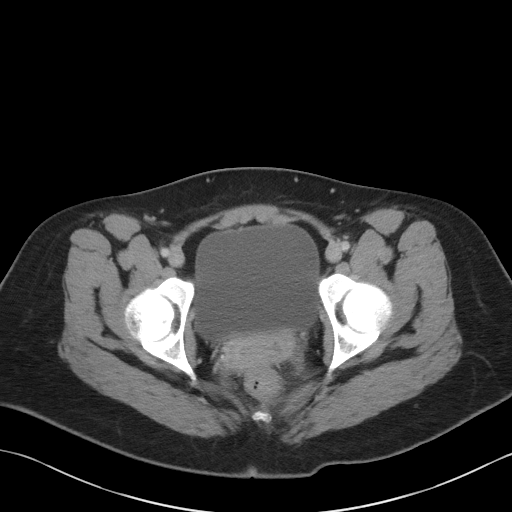
[im 24/86  soft-tissue]
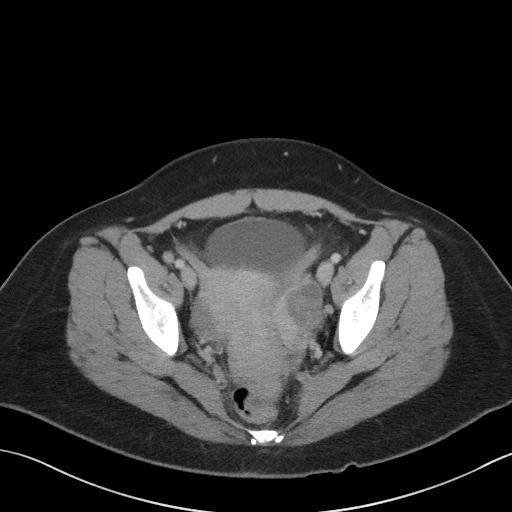
[im 31/86  soft-tissue]
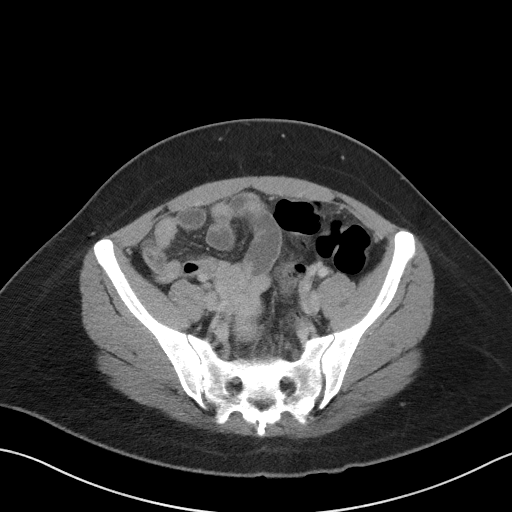
[im 38/86  soft-tissue]
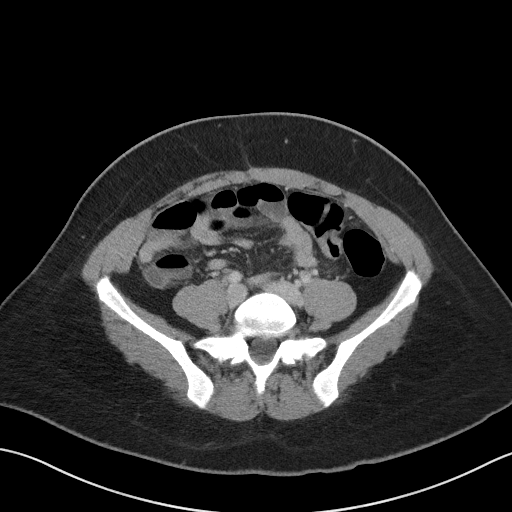
[im 45/86  soft-tissue]
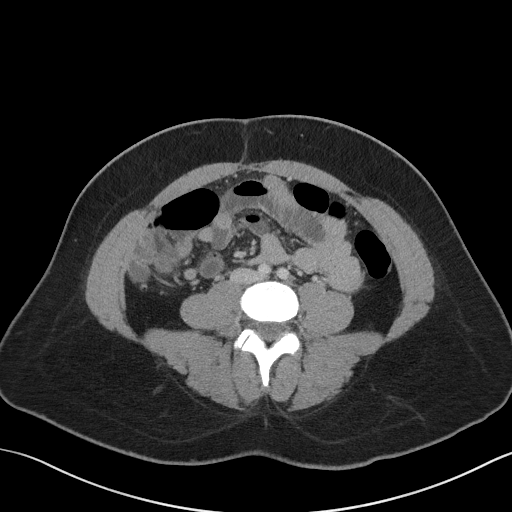
[im 48/86  soft-tissue]
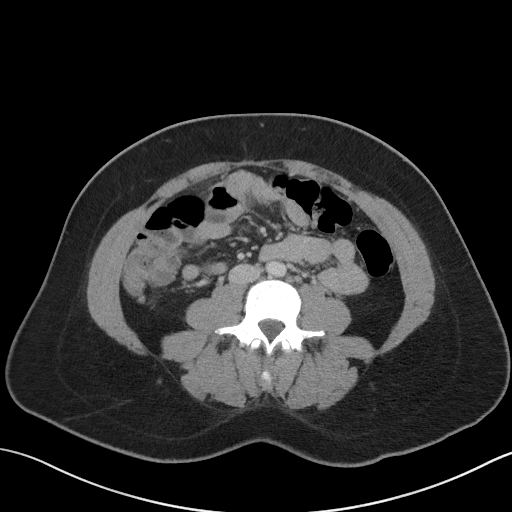
[im 55/86  soft-tissue]
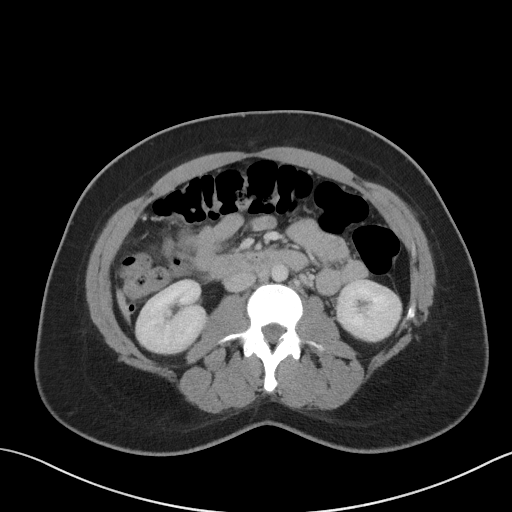
[im 55/86  bone]
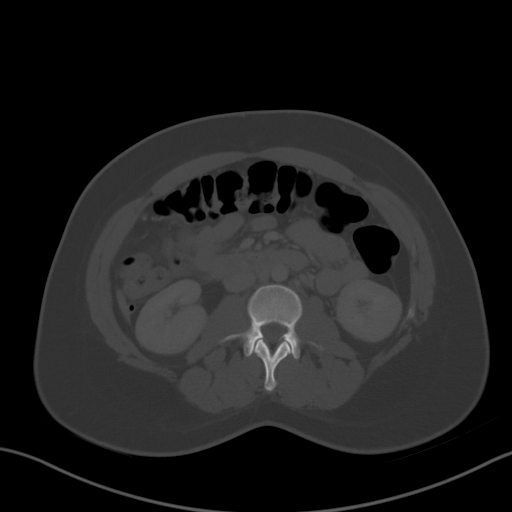
[im 62/86  soft-tissue]
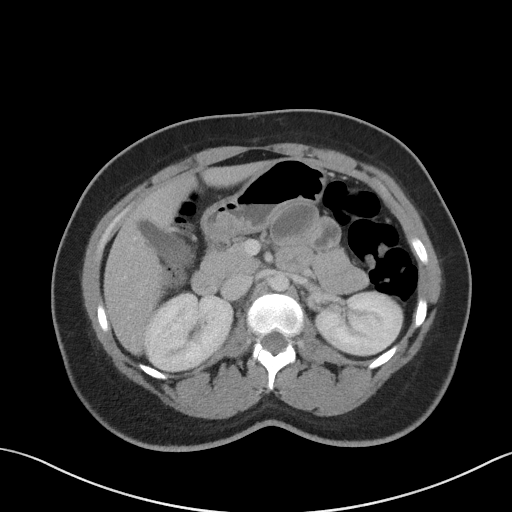
[im 69/86  soft-tissue]
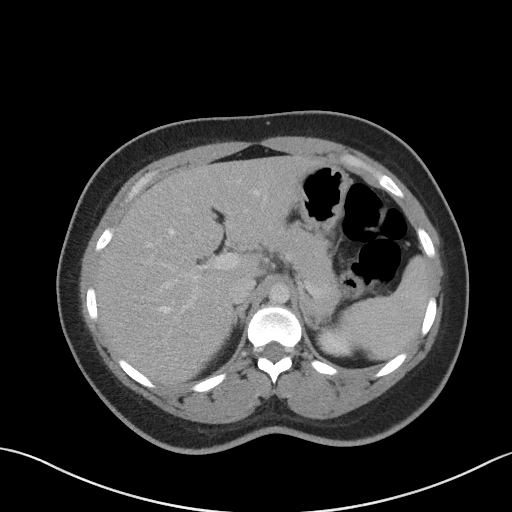
[im 75/86  soft-tissue]
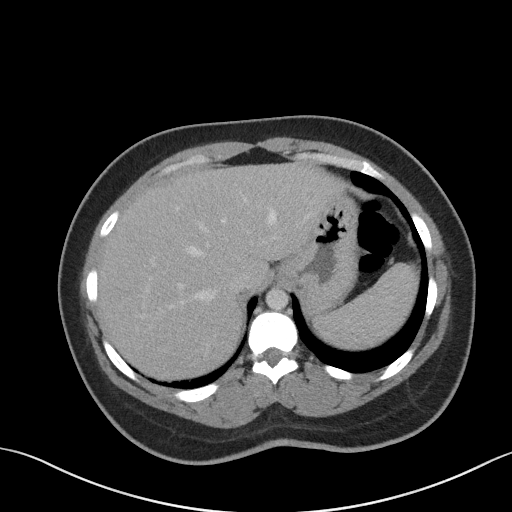
[im 82/86  soft-tissue]
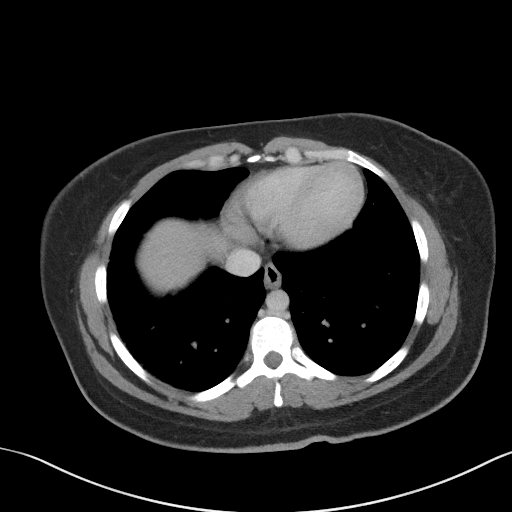

[Series 5: coronal st · coronal · 0.74mm/px · 3 of 84 slices shown]
[im 28/84  soft-tissue]
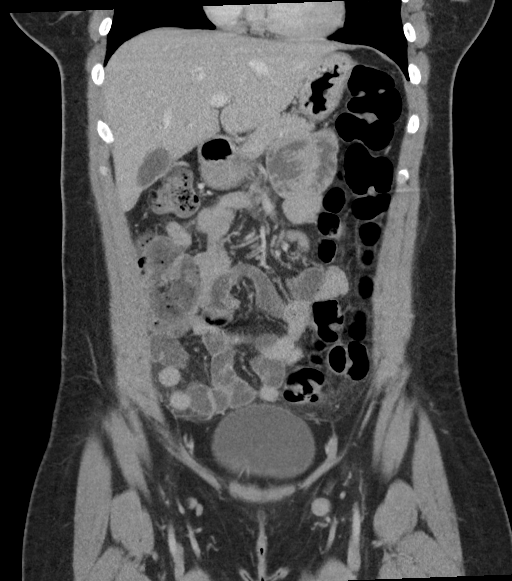
[im 37/84  soft-tissue]
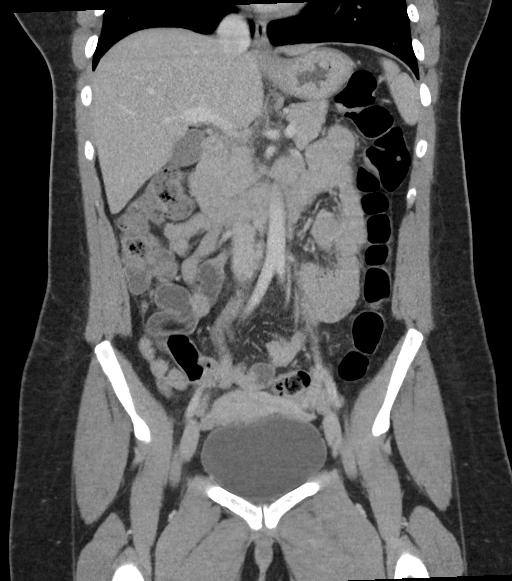
[im 47/84  soft-tissue]
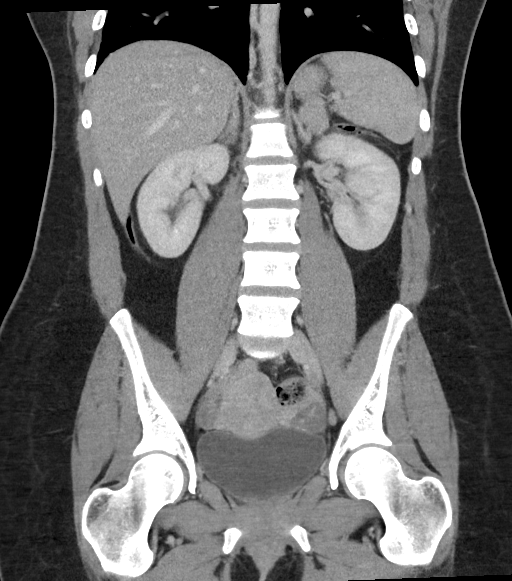

[16 of 46 positions shown; findings below may reference images not displayed]

FINDINGS: Lower chest: Unremarkable.

Hepatobiliary: No suspicious cystic or solid hepatic lesions. No
intra or extrahepatic biliary ductal dilatation. Gallbladder is
normal in appearance.

Pancreas: No pancreatic mass. No pancreatic ductal dilatation. No
pancreatic or peripancreatic fluid or inflammatory changes.

Spleen: Unremarkable.

Adrenals/Urinary Tract: Bilateral kidneys and bilateral adrenal
glands are normal in appearance. No hydroureteronephrosis. Urinary
bladder is normal in appearance.

Stomach/Bowel: Normal appearance of the stomach. No pathologic
dilatation of small bowel or colon. Normal appendix (retrocecal in
location).

Vascular/Lymphatic: No significant atherosclerotic disease, aneurysm
or dissection noted in the abdominal or pelvic vasculature. No
lymphadenopathy noted in the abdomen or pelvis.

Reproductive: Several small uterine lesions are noted is a
heterogeneously, the largest of which enhancing lesion in the
posterior aspect of the uterine body measuring 2.6 cm in diameter
(sagittal image 56 of series 6), most compatible with small
fibroids. Ovaries are unremarkable in appearance.

Other: No significant volume of ascites.  No pneumoperitoneum.

Musculoskeletal: There are no aggressive appearing lytic or blastic
lesions noted in the visualized portions of the skeleton.
IMPRESSION: 1. No acute findings are noted in the abdomen or pelvis to account
for the patient's symptoms.
2. Normal appendix.
3. Fibroid uterus.

## 2019-02-17 LAB — HM HIV SCREENING LAB: HM HIV Screening: NEGATIVE

## 2019-06-28 ENCOUNTER — Ambulatory Visit: Payer: Self-pay

## 2019-06-30 DIAGNOSIS — I10 Essential (primary) hypertension: Secondary | ICD-10-CM | POA: Insufficient documentation

## 2019-07-03 ENCOUNTER — Ambulatory Visit: Payer: Self-pay

## 2019-07-04 ENCOUNTER — Emergency Department
Admission: EM | Admit: 2019-07-04 | Discharge: 2019-07-04 | Disposition: A | Discharge status: Home / Self Care | Payer: Self-pay | Attending: Student | Admitting: Student

## 2019-07-04 ENCOUNTER — Encounter: Payer: Self-pay | Admitting: Emergency Medicine

## 2019-07-04 ENCOUNTER — Ambulatory Visit: Payer: Self-pay

## 2019-07-04 ENCOUNTER — Other Ambulatory Visit: Payer: Self-pay

## 2019-07-04 DIAGNOSIS — Z202 Contact with and (suspected) exposure to infections with a predominantly sexual mode of transmission: Secondary | ICD-10-CM | POA: Insufficient documentation

## 2019-07-04 DIAGNOSIS — F17210 Nicotine dependence, cigarettes, uncomplicated: Secondary | ICD-10-CM | POA: Insufficient documentation

## 2019-07-04 DIAGNOSIS — I10 Essential (primary) hypertension: Secondary | ICD-10-CM | POA: Insufficient documentation

## 2019-07-04 LAB — URINALYSIS, ROUTINE W REFLEX MICROSCOPIC
Bacteria, UA: NONE SEEN
Bilirubin Urine: NEGATIVE
Glucose, UA: NEGATIVE mg/dL
Ketones, ur: NEGATIVE mg/dL
Leukocytes,Ua: NEGATIVE
Nitrite: NEGATIVE
Protein, ur: NEGATIVE mg/dL
Specific Gravity, Urine: 1.013 (ref 1.005–1.030)
pH: 6 (ref 5.0–8.0)

## 2019-07-04 LAB — WET PREP, GENITAL
Clue Cells Wet Prep HPF POC: NONE SEEN
Sperm: NONE SEEN
Trich, Wet Prep: NONE SEEN
Yeast Wet Prep HPF POC: NONE SEEN

## 2019-07-04 LAB — POCT PREGNANCY, URINE: Preg Test, Ur: NEGATIVE

## 2019-07-04 MED ORDER — CEFTRIAXONE SODIUM 250 MG IJ SOLR
250.0000 mg | Freq: Once | INTRAMUSCULAR | Status: AC
  Administered 2019-07-04: 16:00:00 250 mg via INTRAMUSCULAR
  Filled 2019-07-04: qty 250

## 2019-07-04 MED ORDER — LIDOCAINE HCL (PF) 1 % IJ SOLN
2.0000 mL | Freq: Once | INTRAMUSCULAR | Status: AC
  Administered 2019-07-04: 2 mL via INTRADERMAL
  Filled 2019-07-04: qty 5

## 2019-07-04 MED ORDER — AZITHROMYCIN 500 MG PO TABS
1000.0000 mg | ORAL_TABLET | Freq: Once | ORAL | Status: AC
  Administered 2019-07-04: 1000 mg via ORAL
  Filled 2019-07-04: qty 2

## 2019-07-04 NOTE — ED Triage Notes (Addendum)
Pt in via POV, reports pelvic pain and abnormal vaginal discharge since yesterday.  Denies pregnancy, denies urinary symptoms, reports concern for STD.  NAD noted at this time.

## 2019-07-04 NOTE — Discharge Instructions (Addendum)
You have been treated for STD exposure. Your other test results are negative. You should avoid sexual contact until you and your partner(s) have been treated and are symptom-free. Follow-up with the Health Department for further evaluation and testing.

## 2019-07-04 NOTE — ED Provider Notes (Signed)
[facility] Emergency Department Provider Note ____________________________________________  Time seen: 1514  I have reviewed the triage vital signs and the nursing notes.  HISTORY  Chief Complaint  Pelvic Pain  HPI Sue Hall is a 33 y.o. female presents himself to the ED for evaluation of a concern for possible STD exposure.  Patient was notified by her sexual partner that he had some symptoms following an sexual encounter.  Patient denies any fevers or chills or sweats but she does report some pelvic pain and pressure she also reports some vaginal discharge.  She denies any abnormal bleeding,  or abdominal pain.  She has some unassociated, intermittent diarrhea.  Past Medical History:  Diagnosis Date  . Hypertension     Patient Active Problem List   Diagnosis Date Noted  . Hypertension 06/30/2019  . Pelvic inflammatory disease 02/06/2018  . Uterine fibroid 05/07/2015    Past Surgical History:  Procedure Laterality Date  . ECTOPIC PREGNANCY SURGERY Right     Prior to Admission medications   Not on File    Allergies Patient has no known allergies.  Family History  Problem Relation Age of Onset  . Hypertension Mother   . Hypertension Father   . Diabetes Maternal Grandmother   . Hypertension Maternal Grandmother   . Hypertension Paternal Grandmother   . Breast cancer Paternal Grandmother   . Hypertension Paternal Grandfather     Social History Social History   Tobacco Use  . Smoking status: Current Every Day Smoker    Packs/day: 0.50    Types: Cigarettes  . Smokeless tobacco: Never Used  Substance Use Topics  . Alcohol use: Yes  . Drug use: No    Review of Systems  Constitutional: Negative for fever. Cardiovascular: Negative for chest pain. Respiratory: Negative for shortness of breath. Gastrointestinal: Negative for abdominal pain, vomiting and diarrhea. Genitourinary: Negative for dysuria.  Reports vaginal discharge  as above. Musculoskeletal: Negative for back pain. Skin: Negative for rash. Neurological: Negative for headaches, focal weakness or numbness. ____________________________________________  PHYSICAL EXAM:  VITAL SIGNS: ED Triage Vitals  Enc Vitals Group     BP 07/04/19 1422 (!) 153/86     Pulse Rate 07/04/19 1422 89     Resp 07/04/19 1422 16     Temp 07/04/19 1422 98.3 F (36.8 C)     Temp Source 07/04/19 1422 Oral     SpO2 07/04/19 1422 98 %     Weight 07/04/19 1423 180 lb (81.6 kg)     Height 07/04/19 1423 5\' 4"  (1.626 m)     Head Circumference --      Peak Flow --      Pain Score 07/04/19 1422 7     Pain Loc --      Pain Edu? --      Excl. in [place]? --     Constitutional: Alert and oriented. Well appearing and in no distress. Head: Normocephalic and atraumatic. Eyes: Conjunctivae are normal. Normal extraocular movements Cardiovascular: Normal rate, regular rhythm. Normal distal pulses. Respiratory: Normal respiratory effort. No wheezes/rales/rhonchi. GU: normal external genitalia.  There is no significant erythema noted to the cervix.  There is a scant milky white discharge noted in the vaginal vault.  No cervical motion tenderness or adnexal masses are appreciated. Musculoskeletal: Nontender with normal range of motion in all extremities.  Neurologic:  Normal gait without ataxia. Normal speech and language. No gross focal neurologic deficits are appreciated. Skin:  Skin is warm, dry and intact.  No rash noted. Psychiatric: Mood and affect are normal. Patient exhibits appropriate insight and judgment. ____________________________________________   LABS (pertinent positives/negatives)  Labs Reviewed  WET PREP, GENITAL - Abnormal; Notable for the following components:      Result Value   WBC, Wet Prep HPF POC FEW (*)    All other components within normal limits  URINALYSIS, ROUTINE W REFLEX MICROSCOPIC - Abnormal; Notable for the following components:   Color, Urine STRAW  (*)    APPearance CLEAR (*)    Hgb urine dipstick SMALL (*)    All other components within normal limits  POC URINE PREG, ED  POCT PREGNANCY, URINE  ____________________________________________  PROCEDURES  Azithromycin 1 g PO Rocephin 250 mg IM Procedures ____________________________________________  INITIAL IMPRESSION / ASSESSMENT AND PLAN / ED COURSE  Patient with ED evaluation of vaginal discharge and STD exposure.  She is treated empirically for gonorrhea chlamydia at this time.  She is advised to the remaining tests are normal at this time.  She should follow-up with her primary provider or the local health department for ongoing symptom management and testing.  Return to the ED as needed.  Sue Hall was evaluated in Emergency Department on 07/04/2019 for the symptoms described in the history of present illness. She was evaluated in the context of the global COVID-19 pandemic, which necessitated consideration that the patient might be at risk for infection with the SARS-CoV-2 virus that causes COVID-19. Institutional protocols and algorithms that pertain to the evaluation of patients at risk for COVID-19 are in a state of rapid change based on information released by regulatory bodies including the CDC and federal and state organizations. These policies and algorithms were followed during the patient's care in the ED. ____________________________________________  FINAL CLINICAL IMPRESSION(S) / ED DIAGNOSES  Final diagnoses:  Possible exposure to STD      [place] Virrueta, Dannielle Karvonen, PA-C 07/04/19 1657    Lilia Pro., MD 07/05/19 [id number]

## 2019-07-04 NOTE — ED Notes (Signed)
See triage note  Presents with some abd discomfort and vaginal; dicomfort

## 2019-10-31 ENCOUNTER — Ambulatory Visit: Payer: Self-pay

## 2020-01-18 ENCOUNTER — Ambulatory Visit: Payer: Self-pay | Admitting: Physician Assistant

## 2020-01-18 ENCOUNTER — Other Ambulatory Visit: Payer: Self-pay

## 2020-01-18 ENCOUNTER — Encounter: Payer: Self-pay | Admitting: Physician Assistant

## 2020-01-18 DIAGNOSIS — Z113 Encounter for screening for infections with a predominantly sexual mode of transmission: Secondary | ICD-10-CM

## 2020-01-18 LAB — WET PREP FOR TRICH, YEAST, CLUE
Trichomonas Exam: NEGATIVE
Yeast Exam: NEGATIVE

## 2020-01-18 NOTE — Progress Notes (Signed)
[facility] Department STI clinic/screening visit  Subjective:  Sue Hall is a 34 y.o. female being seen today for an STI screening visit. The patient reports they do not have symptoms.  Patient reports that they do not desire a pregnancy in the next year.   They reported they are not interested in discussing contraception today.  Patient's last menstrual period was 12/30/2019 (exact date).   Patient has the following medical conditions:   Patient Active Problem List   Diagnosis Date Noted  . Hypertension 06/30/2019  . Pelvic inflammatory disease 02/06/2018  . Uterine fibroid 05/07/2015    Chief Complaint  Patient presents with  . SEXUALLY TRANSMITTED DISEASE    HPI  Patient reports that she is not having any symptoms but would like a screening today.  Denies chronic conditions, regular medicines and states that she had a surgery due to tubal pregnancy.  LMP 12/30/2019 and normal.  Using condoms as BCM.    See flowsheet for further details and programmatic requirements.    The following portions of the patient's history were reviewed and updated as appropriate: allergies, current medications, past medical history, past social history, past surgical history and problem list.  Objective:  There were no vitals filed for this visit.  Physical Exam Constitutional:      General: She is not in acute distress.    Appearance: Normal appearance.  HENT:     Head: Normocephalic and atraumatic.     Comments: No nits, lice, or hair loss. No cervical, supraclavicular or axillary adenopathy.    Mouth/Throat:     Mouth: Mucous membranes are moist.     Pharynx: Oropharynx is clear. No oropharyngeal exudate or posterior oropharyngeal erythema.  Eyes:     Conjunctiva/sclera: Conjunctivae normal.  Pulmonary:     Effort: Pulmonary effort is normal.  Abdominal:     Palpations: Abdomen is soft. There is no mass.     Tenderness: There is no abdominal tenderness. There is  no guarding or rebound.  Genitourinary:    General: Normal vulva.     Rectum: Normal.     Comments: External genitalia/pubic area without nits, lice, edema, erythema, lesions and inguinal adenopathy. Vagina with normal mucosa and discharge. Cervix without visible lesions. Uterus firm, mobile, nt, no masses, no CMT, no adnexal tenderness or fullness. Musculoskeletal:     Cervical back: Neck supple. No tenderness.  Skin:    General: Skin is warm and dry.     Findings: No bruising, erythema, lesion or rash.  Neurological:     Mental Status: She is alert and oriented to person, place, and time.  Psychiatric:        Mood and Affect: Mood normal.        Behavior: Behavior normal.        Thought Content: Thought content normal.        Judgment: Judgment normal.      Assessment and Plan:  Sue Hall is a 34 y.o. female presenting to the [facility] Department for STI screening  1. Screening for STD (sexually transmitted disease) Patient into clinic without symptoms. Rec condoms with all sex. Await test results.  Counseled that RN will call if needs to RTC for treatment once results are back. Patient requests appointment for [facility] for pap and tetanus update. - WET PREP FOR [place], YEAST, [place] [facility] - Syphilis Serology, [facility]  No follow-ups on file.  Future Appointments  Date Time Provider [place]  01/23/2020  9:40 AM [facility] PROVIDER [facility] None    Jerene Dilling, PA

## 2020-01-18 NOTE — Progress Notes (Signed)
Wet mount reviewed (negative results) and no intervention needed per standing order. Rich Number, RN

## 2020-01-22 ENCOUNTER — Ambulatory Visit: Payer: Self-pay

## 2020-01-23 ENCOUNTER — Ambulatory Visit (LOCAL_COMMUNITY_HEALTH_CENTER): Payer: Self-pay | Admitting: Family Medicine

## 2020-01-23 ENCOUNTER — Ambulatory Visit (LOCAL_COMMUNITY_HEALTH_CENTER): Payer: Self-pay

## 2020-01-23 ENCOUNTER — Encounter: Payer: Self-pay | Admitting: Family Medicine

## 2020-01-23 ENCOUNTER — Other Ambulatory Visit: Payer: Self-pay

## 2020-01-23 ENCOUNTER — Ambulatory Visit: Payer: Self-pay

## 2020-01-23 VITALS — BP 150/96 | Ht 63.0 in | Wt 187.8 lb

## 2020-01-23 DIAGNOSIS — Z23 Encounter for immunization: Secondary | ICD-10-CM

## 2020-01-23 DIAGNOSIS — N751 Abscess of Bartholin's gland: Secondary | ICD-10-CM | POA: Insufficient documentation

## 2020-01-23 DIAGNOSIS — Z3009 Encounter for other general counseling and advice on contraception: Secondary | ICD-10-CM

## 2020-01-23 LAB — GONOCOCCUS CULTURE

## 2020-01-23 MED ORDER — THERA VITAL M PO TABS: 1.0000 | ORAL_TABLET | Freq: Every day | ORAL | 0 refills | Status: AC

## 2020-01-23 NOTE — Progress Notes (Signed)
Patient here today for Pap test and Td vaccine. Last Pap was 05/07/2015, NIL, last PE also at that time. Jenetta Downer, RN

## 2020-01-23 NOTE — Progress Notes (Signed)
Family Planning Visit  Subjective:  Sue Hall is a 34 y.o. being seen today for  Chief Complaint  Patient presents with  . Gynecologic Exam  . Immunizations    Pt has Pelvic inflammatory disease; Hypertension; Uterine fibroid; Bartholin's gland abscess; and History of ectopic pregnancy on their problem list.  HPI  Patient reports here for pap and Tetanus vaccine.    Patient's last menstrual period was 12/30/2019 (exact date). BCM: none Pt desires EC? No - pt open to pregnancy  Last pap: 05/07/2015: NIL  Last breast exam: unsure  Patient reports 6 partner(s) in last year. Do they desire STI screening (if no, why not)? No, done recently  Does the patient desire a pregnancy in the next year? She is open to it, though was told she possibly couldn't have kids s/p ectopic preg surgery  6 y.o., Body mass index is 33.27 kg/m. - Is patient eligible for HA1C diabetes screening based on BMI and age >28?  no  Does the patient have a current or past history of drug use? no No components found for: HCV  See flowsheet for other program required questions.   Health Maintenance Due  Topic Date Due  . COVID-19 Vaccine (1) Never done  . PAP SMEAR-Modifier  05/06/2018    ROS  The following portions of the patient's history were reviewed and updated as appropriate: allergies, current medications, past family history, past medical history, past social history, past surgical history and problem list. Problem list updated.  Objective:  BP (!) 150/96   Ht 5\' 3"  (1.6 m)   Wt 187 lb 12.8 oz (85.2 kg)   LMP 12/30/2019 (Exact Date)   BMI 33.27 kg/m    Physical Exam  Gen: well appearing, NAD HEENT: no scleral icterus Breasts:        Right: Normal. No swelling, mass, nipple discharge, skin change or tenderness.        Left: Normal. No swelling, mass, nipple discharge, skin change or tenderness.  Lung: Normal WOB Ext: well perfused, no edema  PELVIC: Normal appearing  external genitalia; normal appearing vaginal mucosa and cervix.  No abnormal discharge noted.  Pap smear obtained.  Normal uterine size, no other palpable masses, no uterine or adnexal tenderness.   Assessment and Plan:  Sue Hall is a 34 y.o. female presenting to the [facility] Department for a well woman exam/family planning visit  Contraception counseling: Reviewed all forms of birth control options in the tiered based approach including abstinence; over the counter/barrier methods; hormonal contraceptive medication including pill, patch, ring, injection, contraceptive implant; hormonal and nonhormonal IUDs; permanent sterilization options including vasectomy and the various tubal sterilization modalities. Risks, benefits, how to discontinue and typical effectiveness rates were reviewed.  Questions were answered.  Written information was also given to the patient to review.  Patient desires none. She will follow up in  1 year for surveillance.  She was told to call with any further questions, or with any concerns about this method of contraception.  Emphasized use of condoms 100% of the time for STI prevention.  Emergency Contraception: n/a   1. Family planning services -Pap today.  -Tetanus vaccine today. -Encouraged MV use, tob cessation. -Pt declines BCM or STI screening.  -BP elevated today, pt has known dx of HTN, is not currently on BP medication. Advised to connect w/PCP asap to begin medication.  - IGP, Aptima HPV     Return in about 1 year (around 01/22/2021) for yearly  wellness exam.  No future appointments.  Kandee Keen, PA-C

## 2020-01-23 NOTE — Progress Notes (Addendum)
Here today for Pap and Td. See FP chart for VS and notes.Jenetta Downer, RN   Td given evidently expired on 01/22/20--discovered after patient had left clinic. TC to patient to explain situation and offer another appointment for repeat Td or Tdap. Per C. Jen[facility], patient may have either Td or Tdap, and may have asap. Patient counseled that its ok to come back for another dose, and patient agreed--patient scheduled to come 01/24/20 am  after work,  for repeat dose of vaccine.

## 2020-01-24 ENCOUNTER — Ambulatory Visit: Payer: Self-pay

## 2020-01-27 LAB — IGP, APTIMA HPV
HPV Aptima: NEGATIVE
PAP Smear Comment: 0

## 2020-01-29 ENCOUNTER — Telehealth: Payer: Self-pay

## 2020-01-29 DIAGNOSIS — A599 Trichomoniasis, unspecified: Secondary | ICD-10-CM

## 2020-01-30 ENCOUNTER — Ambulatory Visit: Payer: Self-pay

## 2020-01-30 ENCOUNTER — Other Ambulatory Visit: Payer: Self-pay

## 2020-01-30 DIAGNOSIS — A599 Trichomoniasis, unspecified: Secondary | ICD-10-CM

## 2020-01-30 MED ORDER — METRONIDAZOLE 500 MG PO TABS: 2000.0000 mg | ORAL_TABLET | Freq: Once | ORAL | 0 refills | Status: AC

## 2020-01-30 NOTE — Telephone Encounter (Signed)
TC to patient. Verified ID via password/SS#. Informed of positive Trich on pap result and need for tx. Instructed to have partner call for tx appt. Appt scheduled. Aileen Fass, RN

## 2020-05-11 ENCOUNTER — Encounter: Payer: Self-pay | Admitting: Emergency Medicine

## 2020-05-11 ENCOUNTER — Other Ambulatory Visit: Payer: Self-pay

## 2020-05-11 ENCOUNTER — Emergency Department
Admission: EM | Admit: 2020-05-11 | Discharge: 2020-05-11 | Disposition: A | Discharge status: Home / Self Care | Payer: Self-pay | Attending: Emergency Medicine | Admitting: Emergency Medicine

## 2020-05-11 DIAGNOSIS — I10 Essential (primary) hypertension: Secondary | ICD-10-CM | POA: Insufficient documentation

## 2020-05-11 DIAGNOSIS — F17210 Nicotine dependence, cigarettes, uncomplicated: Secondary | ICD-10-CM | POA: Insufficient documentation

## 2020-05-11 DIAGNOSIS — J0100 Acute maxillary sinusitis, unspecified: Secondary | ICD-10-CM | POA: Insufficient documentation

## 2020-05-11 DIAGNOSIS — R519 Headache, unspecified: Secondary | ICD-10-CM

## 2020-05-11 MED ORDER — FEXOFENADINE-PSEUDOEPHED ER 60-120 MG PO TB12: 1.0000 | ORAL_TABLET | Freq: Two times a day (BID) | ORAL | 0 refills | Status: DC

## 2020-05-11 MED ORDER — AMOXICILLIN 875 MG PO TABS: 875.0000 mg | ORAL_TABLET | Freq: Two times a day (BID) | ORAL | 0 refills | Status: DC

## 2020-05-11 MED ORDER — NAPROXEN 500 MG PO TABS: 500.0000 mg | ORAL_TABLET | Freq: Two times a day (BID) | ORAL | Status: DC

## 2020-05-11 NOTE — ED Provider Notes (Signed)
[facility] Emergency Department Provider Note   ____________________________________________   None    (approximate)  I have reviewed the triage vital signs and the nursing notes.   HISTORY  Chief Complaint Headache and Nasal Congestion    HPI Sue Hall is a 34 y.o. female patient complaining of 1/2 weeks of sinus congestion.  Patient states the past 2 days pain has increased causing sinus headache.  Patient also state mild throat irritation secondary to postnasal drainage.  Patient states no relief with Zyrtec's.  Patient rates her pain a 7/10.  Patient described pain as "pressure/achy".         Past Medical History:  Diagnosis Date  . Hypertension     Patient Active Problem List   Diagnosis Date Noted  . Bartholin's gland abscess 01/23/2020  . Hypertension 06/30/2019  . Pelvic inflammatory disease 02/06/2018  . Uterine fibroid 05/07/2015  . History of ectopic pregnancy 11/01/2014    Past Surgical History:  Procedure Laterality Date  . ECTOPIC PREGNANCY SURGERY Right     Prior to Admission medications   Medication Sig Start Date End Date Taking? Authorizing Provider  amoxicillin (AMOXIL) 875 MG tablet Take 1 tablet (875 mg total) by mouth 2 (two) times daily. 05/11/20   Sable Feil, PA-C  fexofenadine-pseudoephedrine (ALLEGRA-D) 60-120 MG 12 hr tablet Take 1 tablet by mouth 2 (two) times daily. 05/11/20   Sable Feil, PA-C  Multiple Vitamin (MULTIVITAMIN) tablet Take 1 tablet by mouth daily.    [provider]  Multiple Vitamins-Minerals (MULTIVITAMIN) tablet Take 1 tablet by mouth daily. 01/23/20   Caren Macadam, MD  naproxen (NAPROSYN) 500 MG tablet Take 1 tablet (500 mg total) by mouth 2 (two) times daily with a meal. 05/11/20   Sable Feil, PA-C    Allergies Patient has no known allergies.  Family History  Problem Relation Age of Onset  . Hypertension Mother   . Hypertension Father   .  Diabetes Maternal Grandmother   . Hypertension Maternal Grandmother   . Hypertension Paternal Grandmother   . Breast cancer Paternal Grandmother        unknown age of onset  . Hypertension Paternal Grandfather     Social History Social History   Tobacco Use  . Smoking status: Current Every Day Smoker    Packs/day: 0.50    Years: 12.00    Pack years: 6.00    Types: Cigarettes  . Smokeless tobacco: Never Used  Vaping Use  . Vaping Use: Never used  Substance Use Topics  . Alcohol use: Yes    Alcohol/week: 12.0 standard drinks    Types: 12 Cans of beer per week  . Drug use: No    Review of Systems Constitutional: No fever/chills Eyes: No visual changes. ENT: No sore throat. Cardiovascular: Denies chest pain. Respiratory: Denies shortness of breath. Gastrointestinal: No abdominal pain.  No nausea, no vomiting.  No diarrhea.  No constipation. Genitourinary: Negative for dysuria. Musculoskeletal: Negative for back pain. Skin: Negative for rash. Neurological: Positive for headaches, but denies focal weakness or numbness. Endocrine:  Hypertension  ____________________________________________   PHYSICAL EXAM:  VITAL SIGNS: ED Triage Vitals  Enc Vitals Group     BP 05/11/20 0845 (!) 160/89     Pulse Rate 05/11/20 0845 (!) 101     Resp 05/11/20 0845 16     Temp 05/11/20 0845 98.4 F (36.9 C)     Temp Source 05/11/20 0845 Oral  SpO2 05/11/20 0845 98 %     Weight 05/11/20 0850 180 lb (81.6 kg)     Height 05/11/20 0850 5\' 3"  (1.6 m)     Head Circumference --      Peak Flow --      Pain Score 05/11/20 0850 7     Pain Loc --      Pain Edu? --      Excl. in [place]? --     Constitutional: Alert and oriented. Well appearing and in no acute distress. Eyes: Conjunctivae are normal. PERRL. EOMI. Head: Atraumatic. Nose: Bilateral maxillary guarding with edematous nasal turbinates. Mouth/Throat: Mucous membranes are moist.  Oropharynx non-erythematous.  Postnasal  drainage. Neck: No stridor.  Hematological/Lymphatic/Immunilogical: No cervical lymphadenopathy. Cardiovascular: Normal rate, regular rhythm. Grossly normal heart sounds.  Good peripheral circulation.  Elevated blood pressure. Respiratory: Normal respiratory effort.  No retractions. Lungs CTAB. Neurologic:  Normal speech and language. No gross focal neurologic deficits are appreciated. No gait instability. Skin:  Skin is warm, dry and intact. No rash noted. Psychiatric: Mood and affect are normal. Speech and behavior are normal.  ____________________________________________   LABS (all labs ordered are listed, but only abnormal results are displayed)  Labs Reviewed - No data to display ____________________________________________  EKG   ____________________________________________  RADIOLOGY  ED MD interpretation:    Official radiology report(s): No results found.  ____________________________________________   PROCEDURES  Procedure(s) performed (including Critical Care):  Procedures   ____________________________________________   INITIAL IMPRESSION / ASSESSMENT AND PLAN / ED COURSE  As part of my medical decision making, I reviewed the following data within the [place]     Patient presents with 1/2 weeks of sinus congestion and 2 days of sinus headache.  Patient complaint physical exam consistent with subacute maxillary sinusitis.  Patient given discharge care instruction advised take medication as directed.  Patient advised to [facility] with [facility].    Sue Hall was evaluated in Emergency Department on 05/11/2020 for the symptoms described in the history of present illness. She was evaluated in the context of the global COVID-19 pandemic, which necessitated consideration that the patient might be at risk for infection with the SARS-CoV-2 virus that causes COVID-19. Institutional protocols and algorithms that pertain to  the evaluation of patients at risk for COVID-19 are in a state of rapid change based on information released by regulatory bodies including the CDC and federal and state organizations. These policies and algorithms were followed during the patient's care in the ED.       ____________________________________________   FINAL CLINICAL IMPRESSION(S) / ED DIAGNOSES  Final diagnoses:  Subacute maxillary sinusitis  Sinus headache     ED Discharge Orders         Ordered    fexofenadine-pseudoephedrine (ALLEGRA-D) 60-120 MG 12 hr tablet  2 times daily     Discontinue  Reprint     05/11/20 0918    amoxicillin (AMOXIL) 875 MG tablet  2 times daily     Discontinue  Reprint     05/11/20 0918    naproxen (NAPROSYN) 500 MG tablet  2 times daily with meals     Discontinue  Reprint     05/11/20 0918           Note:  This document was prepared using Dragon voice recognition software and may include unintentional dictation errors.    Sable Feil, PA-C 05/11/20 8841    Lucrezia Starch, MD 05/11/20 [phone]

## 2020-05-11 NOTE — ED Triage Notes (Signed)
Pt presents to ED via POV with c/o HA and nasal congestion. Pt states possible sinus infection. Pt states feels pain to frontal area of her head at this time.

## 2020-07-18 ENCOUNTER — Emergency Department
Admission: EM | Admit: 2020-07-18 | Discharge: 2020-07-18 | Disposition: A | Discharge status: Home / Self Care | Payer: Medicaid Other | Attending: Student in an Organized Health Care Education/Training Program | Admitting: Student in an Organized Health Care Education/Training Program

## 2020-07-18 ENCOUNTER — Other Ambulatory Visit: Payer: Self-pay

## 2020-07-18 ENCOUNTER — Encounter: Payer: Self-pay | Admitting: Emergency Medicine

## 2020-07-18 DIAGNOSIS — N907 Vulvar cyst: Secondary | ICD-10-CM | POA: Insufficient documentation

## 2020-07-18 DIAGNOSIS — Z5321 Procedure and treatment not carried out due to patient leaving prior to being seen by health care provider: Secondary | ICD-10-CM | POA: Insufficient documentation

## 2020-07-18 NOTE — ED Notes (Signed)
No answer when called several times from lobby 

## 2020-07-18 NOTE — ED Triage Notes (Signed)
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  Pt states she has been having discharge on and off for the last week. Pt states sometimes the discharge is red, sometimes it's a "yellowish looking color"   Pt denies any pain with urination.   Pt denies any pain at this time. Pt states that there has been a "strong smell" for the last 2-3 days   Pt NAD at this time, VSS.

## 2020-07-18 NOTE — ED Notes (Signed)
No answer when called several times from lobby & cell phone 

## 2020-09-20 ENCOUNTER — Ambulatory Visit: Payer: Self-pay

## 2020-10-11 ENCOUNTER — Emergency Department
Admission: EM | Admit: 2020-10-11 | Discharge: 2020-10-11 | Disposition: A | Discharge status: Home / Self Care | Payer: Self-pay | Attending: Emergency Medicine | Admitting: Emergency Medicine

## 2020-10-11 ENCOUNTER — Other Ambulatory Visit: Payer: Self-pay

## 2020-10-11 ENCOUNTER — Emergency Department: Payer: Self-pay

## 2020-10-11 DIAGNOSIS — F17210 Nicotine dependence, cigarettes, uncomplicated: Secondary | ICD-10-CM | POA: Insufficient documentation

## 2020-10-11 DIAGNOSIS — R102 Pelvic and perineal pain: Secondary | ICD-10-CM | POA: Insufficient documentation

## 2020-10-11 DIAGNOSIS — I10 Essential (primary) hypertension: Secondary | ICD-10-CM | POA: Insufficient documentation

## 2020-10-11 LAB — CBC
HCT: 38.8 % (ref 36.0–46.0)
Hemoglobin: 13 g/dL (ref 12.0–15.0)
MCH: 29.8 pg (ref 26.0–34.0)
MCHC: 33.5 g/dL (ref 30.0–36.0)
MCV: 89 fL (ref 80.0–100.0)
Platelets: 278 10*3/uL (ref 150–400)
RBC: 4.36 MIL/uL (ref 3.87–5.11)
RDW: 15.3 % (ref 11.5–15.5)
WBC: 10.6 10*3/uL — ABNORMAL HIGH (ref 4.0–10.5)
nRBC: 0 % (ref 0.0–0.2)

## 2020-10-11 LAB — URINALYSIS, COMPLETE (UACMP) WITH MICROSCOPIC
Bilirubin Urine: NEGATIVE
Glucose, UA: NEGATIVE mg/dL
Ketones, ur: NEGATIVE mg/dL
Leukocytes,Ua: NEGATIVE
Nitrite: NEGATIVE
Protein, ur: NEGATIVE mg/dL
Specific Gravity, Urine: 1.019 (ref 1.005–1.030)
pH: 7 (ref 5.0–8.0)

## 2020-10-11 LAB — LIPASE, BLOOD: Lipase: 35 U/L (ref 11–51)

## 2020-10-11 LAB — CHLAMYDIA/NGC RT PCR (ARMC ONLY)
Chlamydia Tr: NOT DETECTED
N gonorrhoeae: NOT DETECTED

## 2020-10-11 LAB — WET PREP, GENITAL
Clue Cells Wet Prep HPF POC: NONE SEEN
Sperm: NONE SEEN
Trich, Wet Prep: NONE SEEN
Yeast Wet Prep HPF POC: NONE SEEN

## 2020-10-11 LAB — COMPREHENSIVE METABOLIC PANEL
ALT: 27 U/L (ref 0–44)
AST: 25 U/L (ref 15–41)
Albumin: 3.8 g/dL (ref 3.5–5.0)
Alkaline Phosphatase: 81 U/L (ref 38–126)
Anion gap: 8 (ref 5–15)
BUN: 7 mg/dL (ref 6–20)
CO2: 25 mmol/L (ref 22–32)
Calcium: 9.8 mg/dL (ref 8.9–10.3)
Chloride: 105 mmol/L (ref 98–111)
Creatinine, Ser: 0.62 mg/dL (ref 0.44–1.00)
GFR, Estimated: >60 mL/min (ref >60)
Glucose, Bld: 107 mg/dL — ABNORMAL HIGH (ref 70–99)
Potassium: 3.5 mmol/L (ref 3.5–5.1)
Sodium: 138 mmol/L (ref 135–145)
Total Bilirubin: 0.7 mg/dL (ref 0.3–1.2)
Total Protein: 7.7 g/dL (ref 6.5–8.1)

## 2020-10-11 LAB — POC URINE PREG, ED: Preg Test, Ur: NEGATIVE

## 2020-10-11 MED ORDER — TRAMADOL HCL 50 MG PO TABS: 50.0000 mg | ORAL_TABLET | Freq: Four times a day (QID) | ORAL | 0 refills | Status: AC | PRN

## 2020-10-11 NOTE — Discharge Instructions (Signed)
You can Take Tramadol for pain.  Please make follow up appointment with Ob/gyn, Dr. Gilman Schmidt.

## 2020-10-11 NOTE — ED Triage Notes (Signed)
Pt c/o intemittent lower abd pain for the past 3 weeks and in the past week having N/V.Marland Kitchen. states having her normal period now. Pt is in NAD.

## 2020-10-11 NOTE — ED Notes (Signed)
Call to flex pt's ride in lobby

## 2020-10-11 NOTE — ED Provider Notes (Signed)
[facility]-EMERGENCY DEPARTMENT  ____________________________________________  Time seen: Approximately 3:39 PM  I have reviewed the triage vital signs and the nursing notes.   HISTORY  Chief Complaint Abdominal Pain   Historian Patient     HPI Sue Hall is a 35 y.o. female presents to the emergency department with 8 out of 10 sharp pelvic pain that has occurred intermittently over the past 3 weeks.  Patient characterizes the pain as suprapubic in nature.  Patient states that she started her menstrual cycle yesterday and her pain has not changed with onset of menses.  She states that she has had 2 episodes of emesis since her symptoms started.  Denies a history of ovarian cysts or ovarian torsion.  Denies diarrhea.  No associated rhinorrhea, nasal congestion nonproductive cough.  Denies experiencing similar symptoms in the past.  No chest pain, chest tightness or shortness of breath.  No other alleviating measures have been attempted.   Past Medical History:  Diagnosis Date  . Hypertension      Immunizations up to date:  Yes.     Past Medical History:  Diagnosis Date  . Hypertension     Patient Active Problem List   Diagnosis Date Noted  . Bartholin's gland abscess 01/23/2020  . Hypertension 06/30/2019  . Pelvic inflammatory disease 02/06/2018  . Uterine fibroid 05/07/2015  . History of ectopic pregnancy 11/01/2014    Past Surgical History:  Procedure Laterality Date  . ECTOPIC PREGNANCY SURGERY Right     Prior to Admission medications   Medication Sig Start Date End Date Taking? Authorizing Provider  traMADol (ULTRAM) 50 MG tablet Take 1 tablet (50 mg total) by mouth every 6 (six) hours as needed for up to 3 days. 10/11/20 10/14/20 Yes Vallarie Mare M, PA-C  amoxicillin (AMOXIL) 875 MG tablet Take 1 tablet (875 mg total) by mouth 2 (two) times daily. 05/11/20   Sable Feil, PA-C  fexofenadine-pseudoephedrine (ALLEGRA-D) 60-120 MG 12 hr tablet Take 1  tablet by mouth 2 (two) times daily. 05/11/20   Sable Feil, PA-C  Multiple Vitamin (MULTIVITAMIN) tablet Take 1 tablet by mouth daily.    [provider]  Multiple Vitamins-Minerals (MULTIVITAMIN) tablet Take 1 tablet by mouth daily. 01/23/20   Caren Macadam, MD  naproxen (NAPROSYN) 500 MG tablet Take 1 tablet (500 mg total) by mouth 2 (two) times daily with a meal. 05/11/20   Sable Feil, PA-C    Allergies Patient has no known allergies.  Family History  Problem Relation Age of Onset  . Hypertension Mother   . Hypertension Father   . Diabetes Maternal Grandmother   . Hypertension Maternal Grandmother   . Hypertension Paternal Grandmother   . Breast cancer Paternal Grandmother        unknown age of onset  . Hypertension Paternal Grandfather     Social History Social History   Tobacco Use  . Smoking status: Current Every Day Smoker    Packs/day: 0.50    Years: 12.00    Pack years: 6.00    Types: Cigarettes  . Smokeless tobacco: Never Used  Vaping Use  . Vaping Use: Never used  Substance Use Topics  . Alcohol use: Yes    Alcohol/week: 12.0 standard drinks    Types: 12 Cans of beer per week  . Drug use: No     Review of Systems  Constitutional: No fever/chills Eyes:  No discharge ENT: No upper respiratory complaints. Respiratory: no cough. No SOB/ use of accessory muscles  to breath Gastrointestinal: Patient has pelvic pain.  Musculoskeletal: Negative for musculoskeletal pain. Skin: Negative for rash, abrasions, lacerations, ecchymosis.    ____________________________________________   PHYSICAL EXAM:  VITAL SIGNS: ED Triage Vitals [10/11/20 1432]  Enc Vitals Group     BP (!) 159/89     Pulse Rate 98     Resp 17     Temp 98.4 F (36.9 C)     Temp Source Oral     SpO2 100 %     Weight 195 lb (88.5 kg)     Height 5\' 3"  (1.6 m)     Head Circumference      Peak Flow      Pain Score 6     Pain Loc      Pain Edu?      Excl. in [place]?       Constitutional: Alert and oriented. Well appearing and in no acute distress. Eyes: Conjunctivae are normal. PERRL. EOMI. Head: Atraumatic. Cardiovascular: Normal rate, regular rhythm. Normal S1 and S2.  Good peripheral circulation. Respiratory: Normal respiratory effort without tachypnea or retractions. Lungs CTAB. Good air entry to the bases with no decreased or absent breath sounds Gastrointestinal: Bowel sounds x 4 quadrants. Soft and nontender to palpation. No guarding or rigidity. No distention. Musculoskeletal: Full range of motion to all extremities. No obvious deformities noted Neurologic:  Normal for age. No gross focal neurologic deficits are appreciated.  Skin:  Skin is warm, dry and intact. No rash noted. Psychiatric: Mood and affect are normal for age. Speech and behavior are normal.   ____________________________________________   LABS (all labs ordered are listed, but only abnormal results are displayed)  Labs Reviewed  WET PREP, GENITAL - Abnormal; Notable for the following components:      Result Value   WBC, Wet Prep HPF POC MODERATE (*)    All other components within normal limits  COMPREHENSIVE METABOLIC PANEL - Abnormal; Notable for the following components:   Glucose, Bld 107 (*)    All other components within normal limits  CBC - Abnormal; Notable for the following components:   WBC 10.6 (*)    All other components within normal limits  URINALYSIS, COMPLETE (UACMP) WITH MICROSCOPIC - Abnormal; Notable for the following components:   Color, Urine YELLOW (*)    APPearance CLEAR (*)    Hgb urine dipstick LARGE (*)    Bacteria, UA RARE (*)    All other components within normal limits  CHLAMYDIA/NGC RT PCR ([facility] ONLY)  LIPASE, BLOOD  POC URINE PREG, ED   ____________________________________________  EKG   ____________________________________________  RADIOLOGY   US PELVIC COMPLETE W TRANSVAGINAL AND TORSION R/O  Result Date:  10/11/2020 CLINICAL DATA:  Pelvic pain EXAM: TRANSABDOMINAL AND TRANSVAGINAL ULTRASOUND OF PELVIS DOPPLER ULTRASOUND OF OVARIES TECHNIQUE: Both transabdominal and transvaginal ultrasound examinations of the pelvis were performed. Transabdominal technique was performed for global imaging of the pelvis including uterus, ovaries, adnexal regions, and pelvic cul-de-sac. It was necessary to proceed with endovaginal exam following the transabdominal exam to visualize the ovaries. Color and duplex Doppler ultrasound was utilized to evaluate blood flow to the ovaries. COMPARISON:  CT dated December 07, 2017 FINDINGS: Uterus Measurements: 10.3 x 4.3 x 4.3 cm = volume: 100 mL. There is a 5.6 cm fibroid. Endometrium Thickness: 4 mm.  No focal abnormality visualized. Right ovary Measurements: 2.8 x 2 x 1.7 cm = volume: 5 mL. Normal appearance/no adnexal mass. Left ovary Measurements: 8 x 6.1 x 4.7 cm =  volume: 120 mL. There is a complex cyst measuring 6.9 x 5.7 x 3.9 cm. This cystic mass contains lace-like internal echoes but no significant internal color Doppler flow. Pulsed Doppler evaluation of both ovaries demonstrates normal low-resistance arterial and venous waveforms. Other findings No abnormal free fluid. IMPRESSION: 1. Fibroid uterus. 2. There is a complex cystic mass involving the left ovary measuring up to approximately 6.9 cm. This is favored to represent a hemorrhagic cyst. Recommend follow-up US in 3-6 months. Note: This recommendation does not apply to premenarchal patients or to those with increased risk (genetic, family history, elevated tumor markers or other high-risk factors) of ovarian cancer. Reference: Radiology 2019 Nov; 293(2):359-371. Electronically Signed   By: Constance Holster M.D.   On: 10/11/2020 17:44    ____________________________________________    PROCEDURES  Procedure(s) performed:     Procedures     Medications - No data to  display   ____________________________________________   INITIAL IMPRESSION / ASSESSMENT AND PLAN / ED COURSE  Pertinent labs & imaging results that were available during my care of the patient were reviewed by me and considered in my medical decision making (see chart for details).      Assessment and plan Pelvic pain 35 year old female presents to the emergency department with pelvic pain that is occurred intermittently for the past 3 weeks.  Patient was hypertensive at triage but vital signs were otherwise reassuring.  CBC indicated mild leukocytosis but CMP was within reference range.  Urinalysis indicated a large amount of blood but no other abnormalities.  Urine pregnancy test was negative.  Lipase is within reference range.  Gonorrhea and Chlamydia testing were negative and wet prep only indicated white blood cells.  Pelvic ultrasound revealed no signs of torsion but did show findings suggestive of a near 7 cm complex ovarian cyst likely hemorrhagic in nature.  Patient was discharged with a short course of tramadol for discomfort advised to follow-up with OB/GYN.  Return precautions were given to return with new or worsening symptoms.  All patient questions were answered.   ____________________________________________  FINAL CLINICAL IMPRESSION(S) / ED DIAGNOSES  Final diagnoses:  Pelvic pain      NEW MEDICATIONS STARTED DURING THIS VISIT:  ED Discharge Orders         Ordered    traMADol (ULTRAM) 50 MG tablet  Every 6 hours PRN        10/11/20 1801              This chart was dictated using voice recognition software/Dragon. Despite best efforts to proofread, errors can occur which can change the meaning. Any change was purely unintentional.     Lannie Fields, PA-C 10/11/20 1901    Nance Pear, MD 10/11/20 Kathyrn Drown

## 2020-12-11 ENCOUNTER — Other Ambulatory Visit: Payer: Self-pay

## 2020-12-11 ENCOUNTER — Emergency Department: Payer: Medicaid Other

## 2020-12-11 ENCOUNTER — Emergency Department
Admission: EM | Admit: 2020-12-11 | Discharge: 2020-12-11 | Disposition: A | Discharge status: Home / Self Care | Payer: Medicaid Other | Attending: Emergency Medicine | Admitting: Emergency Medicine

## 2020-12-11 DIAGNOSIS — Z20822 Contact with and (suspected) exposure to covid-19: Secondary | ICD-10-CM | POA: Insufficient documentation

## 2020-12-11 DIAGNOSIS — J069 Acute upper respiratory infection, unspecified: Secondary | ICD-10-CM | POA: Insufficient documentation

## 2020-12-11 DIAGNOSIS — F17210 Nicotine dependence, cigarettes, uncomplicated: Secondary | ICD-10-CM | POA: Insufficient documentation

## 2020-12-11 DIAGNOSIS — Z1152 Encounter for screening for COVID-19: Secondary | ICD-10-CM

## 2020-12-11 DIAGNOSIS — I10 Essential (primary) hypertension: Secondary | ICD-10-CM | POA: Insufficient documentation

## 2020-12-11 LAB — SARS CORONAVIRUS 2 (TAT 6-24 HRS): SARS Coronavirus 2: NEGATIVE

## 2020-12-11 NOTE — ED Notes (Signed)
See triage note, pt reports cough, congestion and body aches since Sunday. Ambulatory to treatment room, NAD noted

## 2020-12-11 NOTE — ED Triage Notes (Signed)
Patient ambulatory to triage with steady gait, without difficulty or distress noted; pt reports since Sunday having nasal congestion, nonprod cough and frontal HA

## 2020-12-11 NOTE — ED Provider Notes (Signed)
[facility] Emergency Department Provider Note   ____________________________________________   None    (approximate)  I have reviewed the triage vital signs and the nursing notes.   HISTORY  Chief Complaint Cough   HPI TARALYN FERRAIOLO is a 35 y.o. female presents to the ED with complaint of cough, congestion, body aches for 3 days.  Patient is unaware of any fever but reports chills.  She is also had frontal headache.  Patient states she has been taking allergy medicine that makes her sleepy.  She reports that she has had COVID twice and is unvaccinated.         Past Medical History:  Diagnosis Date  . Hypertension     Patient Active Problem List   Diagnosis Date Noted  . Bartholin's gland abscess 01/23/2020  . Hypertension 06/30/2019  . Pelvic inflammatory disease 02/06/2018  . Uterine fibroid 05/07/2015  . History of ectopic pregnancy 11/01/2014    Past Surgical History:  Procedure Laterality Date  . ECTOPIC PREGNANCY SURGERY Right     Prior to Admission medications   Medication Sig Start Date End Date Taking? Authorizing Provider  Multiple Vitamin (MULTIVITAMIN) tablet Take 1 tablet by mouth daily.    [provider]  Multiple Vitamins-Minerals (MULTIVITAMIN) tablet Take 1 tablet by mouth daily. 01/23/20   Caren Macadam, MD    Allergies Patient has no known allergies.  Family History  Problem Relation Age of Onset  . Hypertension Mother   . Hypertension Father   . Diabetes Maternal Grandmother   . Hypertension Maternal Grandmother   . Hypertension Paternal Grandmother   . Breast cancer Paternal Grandmother        unknown age of onset  . Hypertension Paternal Grandfather     Social History Social History   Tobacco Use  . Smoking status: Current Every Day Smoker    Packs/day: 0.50    Years: 12.00    Pack years: 6.00    Types: Cigarettes  . Smokeless tobacco: Never Used  Vaping Use  . Vaping  Use: Never used  Substance Use Topics  . Alcohol use: Yes    Alcohol/week: 12.0 standard drinks    Types: 12 Cans of beer per week  . Drug use: No    Review of Systems Constitutional: No fever/positive chills Eyes: No visual changes. ENT: No sore throat.  Positive for nasal congestion. Cardiovascular: Denies chest pain. Respiratory: Denies shortness of breath.  Positive cough. Gastrointestinal: No abdominal pain.  No nausea, no vomiting.  No diarrhea.  Genitourinary: Negative for dysuria. Musculoskeletal: Positive for muscle aches. Skin: Negative for rash. Neurological: Negative for headaches, focal weakness or numbness.  ____________________________________________   PHYSICAL EXAM:  VITAL SIGNS: ED Triage Vitals  Enc Vitals Group     BP 12/11/20 0548 (!) 167/100     Pulse Rate 12/11/20 0548 (!) 110     Resp 12/11/20 0548 18     Temp 12/11/20 0548 98.2 F (36.8 C)     Temp Source 12/11/20 0548 Oral     SpO2 12/11/20 0548 95 %     Weight 12/11/20 0547 190 lb (86.2 kg)     Height 12/11/20 0547 5\' 3"  (1.6 m)     Head Circumference --      Peak Flow --      Pain Score 12/11/20 0547 7     Pain Loc --      Pain Edu? --      Excl. in [place]? --  Constitutional: Alert and oriented. Well appearing and in no acute distress. Eyes: Conjunctivae are normal. PERRL. EOMI. Head: Atraumatic. Nose: Positive congestion/rhinnorhea. Mouth/Throat: Mucous membranes are moist.  Oropharynx non-erythematous. Neck: No stridor.   Cardiovascular: Normal rate, regular rhythm. Grossly normal heart sounds.  Good peripheral circulation. Respiratory: Normal respiratory effort.  No retractions. Lungs CTAB. Gastrointestinal: Soft and nontender. No distention.  Musculoskeletal: Moves upper and lower extremities without any difficulty and patient is ambulatory without any assistance. Neurologic:  Normal speech and language. No gross focal neurologic deficits are appreciated. No gait  instability. Skin:  Skin is warm, dry and intact. No rash noted. Psychiatric: Mood and affect are normal. Speech and behavior are normal.  ____________________________________________   LABS (all labs ordered are listed, but only abnormal results are displayed)  Labs Reviewed  SARS CORONAVIRUS 2 (TAT 6-24 HRS)   ____________________________________________ ____________________________________________  RADIOLOGY Leana Gamer, personally viewed and evaluated these images (plain radiographs) as part of my medical decision making, as well as reviewing the written report by the radiologist.   Official radiology report(s): DG Chest 2 View  Result Date: 12/11/2020 CLINICAL DATA:  Cough. EXAM: CHEST - 2 VIEW COMPARISON:  Chest x-ray report 09/02/2003. FINDINGS: Mediastinum hilar structures normal. Lungs are clear. No focal infiltrate. No pleural effusion or pneumothorax. Heart size normal. IMPRESSION: No acute cardiopulmonary disease. Electronically Signed   By: Marcello Moores  Register   On: 12/11/2020 06:30    ____________________________________________   PROCEDURES  Procedure(s) performed (including Critical Care):  Procedures   ____________________________________________   INITIAL IMPRESSION / ASSESSMENT AND PLAN / ED COURSE  As part of my medical decision making, I reviewed the following data within the electronic MEDICAL RECORD NUMBER Notes from prior ED visits and [place] Controlled Substance Database  35 year old female presents to the ED with complaint of 3 days productive cough, congestion, body aches, and headache.  Patient has taken some over-the-counter allergy medicine without complete relief.  Patient reports that she has had COVID twice and is still currently unvaccinated.  Physical exam was consistent with a viral URI with cough.  A Covid test was obtained and patient was given information about getting a primary care provider with multiple clinics listed on her discharge  papers.  She is also instructed to get over-the-counter medication to control her symptoms.  She was discharged with instructions to check her COVID test on MyChart.  If she develops any severe worsening of her symptoms she is to return to the emergency department. ____________________________________________   FINAL CLINICAL IMPRESSION(S) / ED DIAGNOSES  Final diagnoses:  Viral URI with cough  Encounter for screening for COVID-19     ED Discharge Orders    None      *Please note:  ALIESE BRANNUM was evaluated in Emergency Department on 12/11/2020 for the symptoms described in the history of present illness. She was evaluated in the context of the global COVID-19 pandemic, which necessitated consideration that the patient might be at risk for infection with the SARS-CoV-2 virus that causes COVID-19. Institutional protocols and algorithms that pertain to the evaluation of patients at risk for COVID-19 are in a state of rapid change based on information released by regulatory bodies including the CDC and federal and state organizations. These policies and algorithms were followed during the patient's care in the ED.  Some ED evaluations and interventions may be delayed as a result of limited staffing during and the pandemic.*   Note:  This document was prepared using Dragon voice  recognition software and may include unintentional dictation errors.    Johnn Hai, PA-C 12/11/20 2548    Harvest Dark, MD 12/11/20 1258

## 2020-12-11 NOTE — Discharge Instructions (Addendum)
Follow-up with your primary care provider if any continued problems.  If you do not have a primary care provider you should call one of the clinics listed above and obtain a primary care provider for any future needs.  Increase fluids.  Take Tylenol or ibuprofen as needed for body aches, headache and fever.  You need to quarantine until you have seen the results of your COVID test on MyChart.  If you are positive you will need to quarantine additional 7 days as you have not been vaccinated.  There are no medications for COVID however you can treat the symptoms with medications over-the-counter.

## 2020-12-12 ENCOUNTER — Telehealth: Payer: Self-pay | Admitting: General Practice

## 2020-12-12 NOTE — Telephone Encounter (Signed)
Received an ER referral for pt. Tried calling home and mobile but was unable to LVM or reach pt.

## 2021-01-15 ENCOUNTER — Emergency Department
Admission: EM | Admit: 2021-01-15 | Discharge: 2021-01-15 | Disposition: A | Discharge status: Home / Self Care | Payer: Medicaid Other | Attending: Emergency Medicine | Admitting: Emergency Medicine

## 2021-01-15 ENCOUNTER — Other Ambulatory Visit: Payer: Self-pay

## 2021-01-15 DIAGNOSIS — Z79899 Other long term (current) drug therapy: Secondary | ICD-10-CM | POA: Insufficient documentation

## 2021-01-15 DIAGNOSIS — F17210 Nicotine dependence, cigarettes, uncomplicated: Secondary | ICD-10-CM | POA: Insufficient documentation

## 2021-01-15 DIAGNOSIS — I10 Essential (primary) hypertension: Secondary | ICD-10-CM | POA: Insufficient documentation

## 2021-01-15 MED ORDER — HYDROCHLOROTHIAZIDE 12.5 MG PO CAPS
12.5000 mg | ORAL_CAPSULE | Freq: Every day | ORAL | Status: DC
  Administered 2021-01-15: 12.5 mg via ORAL
  Filled 2021-01-15: qty 1

## 2021-01-15 MED ORDER — HYDROCHLOROTHIAZIDE 12.5 MG PO CAPS: 12.5000 mg | ORAL_CAPSULE | Freq: Every day | ORAL | 6 refills | Status: DC

## 2021-01-15 MED ORDER — ASPIRIN-ACETAMINOPHEN-CAFFEINE 250-250-65 MG PO TABS
1.0000 | ORAL_TABLET | Freq: Once | ORAL | Status: AC
  Administered 2021-01-15: 1 via ORAL
  Filled 2021-01-15 (×2): qty 1

## 2021-01-15 NOTE — ED Notes (Signed)
Pt verbalized understanding of d/c instructions at this time. Pt denied further questions. Pt ambulatory to ED lobby at this time, NAD noted, steady gait noted, RR even and unlabored at this time

## 2021-01-15 NOTE — ED Triage Notes (Signed)
Pt to ED POV for headache d/t htn. States she has hx of htn, but does not take prescribed meds d/t not have pcp. Denies cp Reports improvement of headache at this time

## 2021-01-15 NOTE — ED Provider Notes (Signed)
[facility] Emergency Department Provider Note  ____________________________________________  Time seen: Approximately 7:53 PM  I have reviewed the triage vital signs and the nursing notes.   HISTORY  Chief Complaint Hypertension    HPI Sue Hall is a 35 y.o. female who presents the emergency department complaining of hypertension.  Patient states that she has known that she has had high blood pressure for a while but does not take any medicines as she does not have a PCP or insurance.  Patient states that she can typically tell when her blood pressure was up and had a headache today prompting her to check her blood pressure.  She states that it was elevated and she presented for evaluation and medications.  Patient states that she would like to be on a medication at this time for her high blood pressure.  Blood pressure was 149/98 here.  Patient was complaining of no concerning neuro symptoms of weakness, difficulty formulating thoughts or words.  No chest pain, shortness of breath.  No complaints at this time.         Past Medical History:  Diagnosis Date  . Hypertension     Patient Active Problem List   Diagnosis Date Noted  . Bartholin's gland abscess 01/23/2020  . Hypertension 06/30/2019  . Pelvic inflammatory disease 02/06/2018  . Uterine fibroid 05/07/2015  . History of ectopic pregnancy 11/01/2014    Past Surgical History:  Procedure Laterality Date  . ECTOPIC PREGNANCY SURGERY Right     Prior to Admission medications   Medication Sig Start Date End Date Taking? Authorizing Provider  hydrochlorothiazide (MICROZIDE) 12.5 MG capsule Take 1 capsule (12.5 mg total) by mouth daily. 01/15/21 01/15/22 Yes Kaylena Pacifico, Charline Bills, PA-C  Multiple Vitamin (MULTIVITAMIN) tablet Take 1 tablet by mouth daily.    [provider]  Multiple Vitamins-Minerals (MULTIVITAMIN) tablet Take 1 tablet by mouth daily. 01/23/20   Caren Macadam,  MD    Allergies Patient has no known allergies.  Family History  Problem Relation Age of Onset  . Hypertension Mother   . Hypertension Father   . Diabetes Maternal Grandmother   . Hypertension Maternal Grandmother   . Hypertension Paternal Grandmother   . Breast cancer Paternal Grandmother        unknown age of onset  . Hypertension Paternal Grandfather     Social History Social History   Tobacco Use  . Smoking status: Current Every Day Smoker    Packs/day: 0.50    Years: 12.00    Pack years: 6.00    Types: Cigarettes  . Smokeless tobacco: Never Used  Vaping Use  . Vaping Use: Never used  Substance Use Topics  . Alcohol use: Yes    Alcohol/week: 12.0 standard drinks    Types: 12 Cans of beer per week  . Drug use: No     Review of Systems  Constitutional: No fever/chills.  Positive for hypertension Eyes: No visual changes. No discharge ENT: No upper respiratory complaints. Cardiovascular: no chest pain. Respiratory: no cough. No SOB. Gastrointestinal: No abdominal pain.  No nausea, no vomiting.  No diarrhea.  No constipation. Musculoskeletal: Negative for musculoskeletal pain. Skin: Negative for rash, abrasions, lacerations, ecchymosis. Neurological: Negative for headaches, focal weakness or numbness.  10 System ROS otherwise negative.  ____________________________________________   PHYSICAL EXAM:  VITAL SIGNS: ED Triage Vitals [01/15/21 1816]  Enc Vitals Group     BP (!) 149/98     Pulse Rate 86  Resp 18     Temp 98 F (36.7 C)     Temp Source Oral     SpO2 99 %     Weight 180 lb (81.6 kg)     Height 5\' 3"  (1.6 m)     Head Circumference      Peak Flow      Pain Score 0     Pain Loc      Pain Edu?      Excl. in [place]?      Constitutional: Alert and oriented. Well appearing and in no acute distress. Eyes: Conjunctivae are normal. PERRL. EOMI. Head: Atraumatic. ENT:      Ears:       Nose: No congestion/rhinnorhea.      Mouth/Throat:  Mucous membranes are moist.  Neck: No stridor.   Hematological/Lymphatic/Immunilogical: No cervical lymphadenopathy. Cardiovascular: Normal rate, regular rhythm. Normal S1 and S2.  Good peripheral circulation. Respiratory: Normal respiratory effort without tachypnea or retractions. Lungs CTAB. Good air entry to the bases with no decreased or absent breath sounds. Musculoskeletal: Full range of motion to all extremities. No gross deformities appreciated. Neurologic:  Normal speech and language. No gross focal neurologic deficits are appreciated.  Cranial nerves II through XII grossly intact.  Negative Romberg's and pronator drift. Skin:  Skin is warm, dry and intact. No rash noted. Psychiatric: Mood and affect are normal. Speech and behavior are normal. Patient exhibits appropriate insight and judgement.   ____________________________________________   LABS (all labs ordered are listed, but only abnormal results are displayed)  Labs Reviewed - No data to display ____________________________________________  EKG   ____________________________________________  RADIOLOGY   No results found.  ____________________________________________    PROCEDURES  Procedure(s) performed:    Procedures    Medications  aspirin-acetaminophen-caffeine (EXCEDRIN MIGRAINE) per tablet 1 tablet (has no administration in time range)  hydrochlorothiazide (MICROZIDE) capsule 12.5 mg (has no administration in time range)     ____________________________________________   INITIAL IMPRESSION / ASSESSMENT AND PLAN / ED COURSE  Pertinent labs & imaging results that were available during my care of the patient were reviewed by me and considered in my medical decision making (see chart for details).  Review of the [place] CSRS was performed in accordance of the [place] prior to dispensing any controlled drugs.           Patient's diagnosis is consistent with hypertension.  Patient presented to  emergency department with a complaint of headache and hypertension.  Patient states that she had a headache earlier today which prompted her to check her blood pressure.  Patient states that she has had persistently elevated blood pressure for a while.  She was on antihypertensives which she believes was HCTZ previously but has not been on any medicine for quite some time.  Patient states that she has a very mild headache at this time.  It is not the worst headache she is ever had, no visual changes, no unilateral weakness.  Neuro exam was reassuring.  Patient did not want imaging and she states that she thinks that she just due to her blood pressure.  I feel this is reasonable at this time as she says it is a very mild nonspecific headache.  Patient states that she experiences these when her blood pressure is up.  Patient arrived with a blood pressure 149/98.  Again exam was reassuring.  Patient will be started on HCTZ which sounds like the patient had been on before..  Patient states that she  is establishing with primary care.  Follow-up with primary care once this is established.  Return precautions discussed with the patient.  Patient is given ED precautions to return to the ED for any worsening or new symptoms.     ____________________________________________  FINAL CLINICAL IMPRESSION(S) / ED DIAGNOSES  Final diagnoses:  Primary hypertension      NEW MEDICATIONS STARTED DURING THIS VISIT:  ED Discharge Orders         Ordered    hydrochlorothiazide (MICROZIDE) 12.5 MG capsule  Daily        01/15/21 2017              This chart was dictated using voice recognition software/Dragon. Despite best efforts to proofread, errors can occur which can change the meaning. Any change was purely unintentional.    Darletta Moll, PA-C 01/15/21 2019    Lavonia Drafts, MD 01/15/21 2203

## 2021-04-03 ENCOUNTER — Ambulatory Visit: Payer: Medicaid Other

## 2021-05-27 ENCOUNTER — Ambulatory Visit: Payer: Medicaid Other

## 2021-08-30 IMAGING — US US PELVIS COMPLETE TRANSABD/TRANSVAG W DUPLEX
1 series · 13 of 25 positions shown · non-contrast
Comparison: CT dated December 07, 2017

CLINICAL DATA: Pelvic pain

EXAM:
TRANSABDOMINAL AND TRANSVAGINAL ULTRASOUND OF PELVIS
DOPPLER ULTRASOUND OF OVARIES
TECHNIQUE: Both transabdominal and transvaginal ultrasound examinations of the
pelvis were performed. Transabdominal technique was performed for
global imaging of the pelvis including uterus, ovaries, adnexal
regions, and pelvic cul-de-sac.
It was necessary to proceed with endovaginal exam following the
transabdominal exam to visualize the ovaries. Color and duplex
Doppler ultrasound was utilized to evaluate blood flow to the
ovaries.

[Series 1: us pelvic complete w transvaginal and torsion righ · 83 acquisitions, 13 frames shown]
[im 1/83]
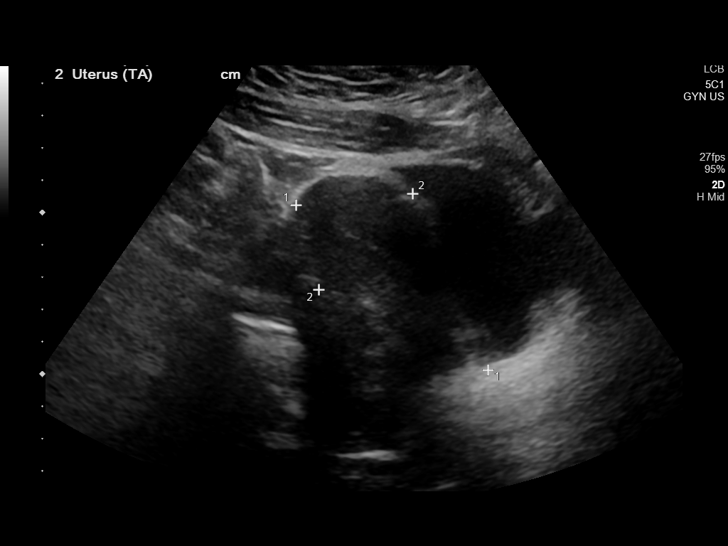
[im 7/83]
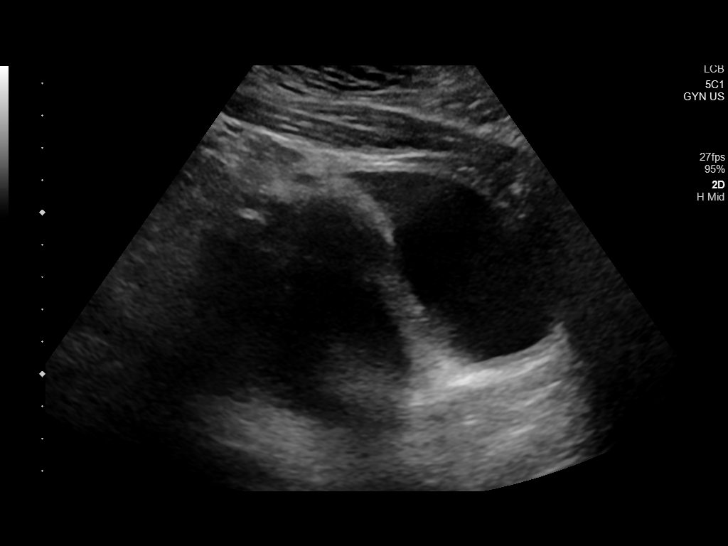
[im 14/83]
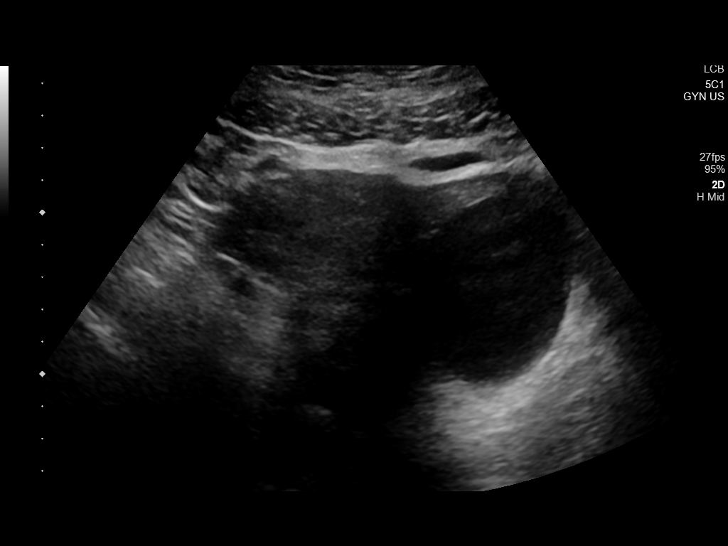
[im 21/83]
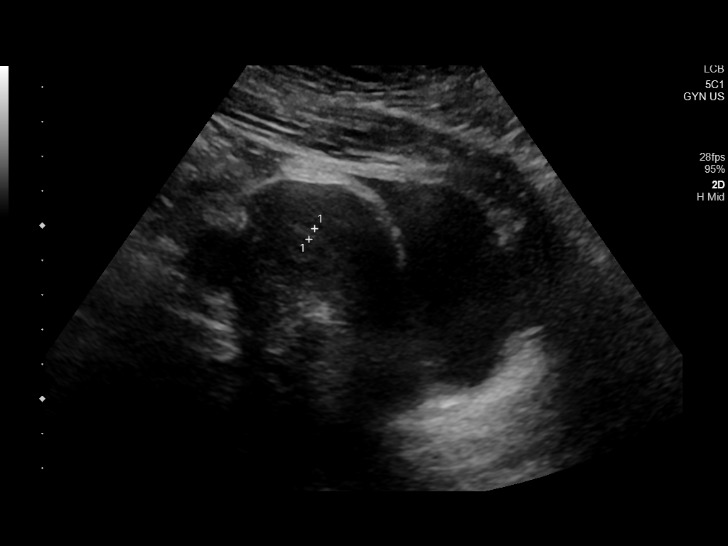
[im 28/83]
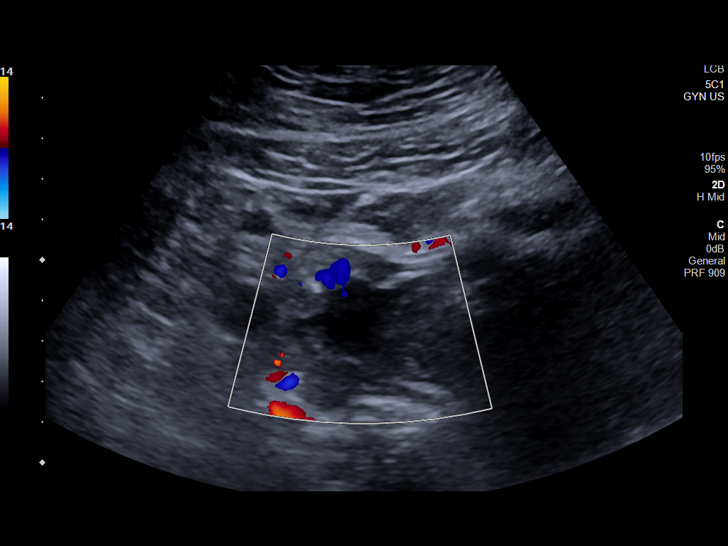
[im 35/83]
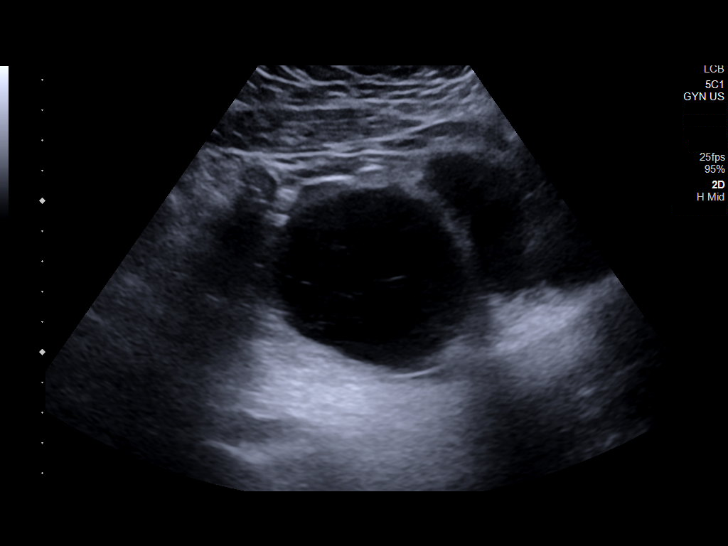
[im 42/83]
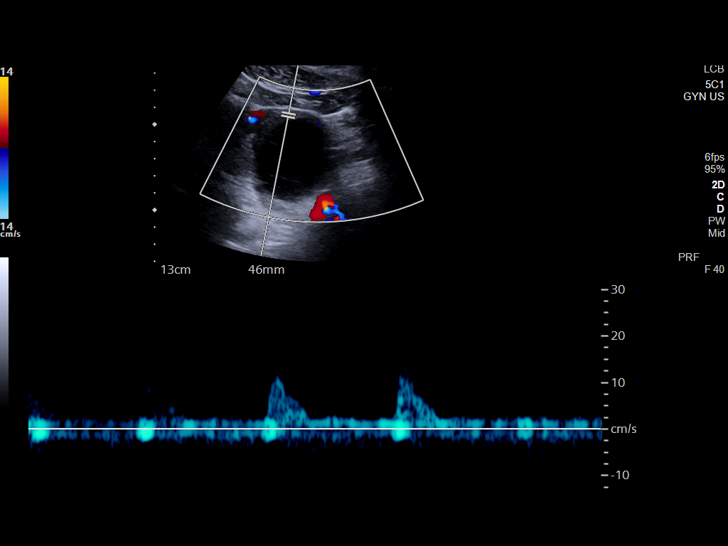
[im 48/83]
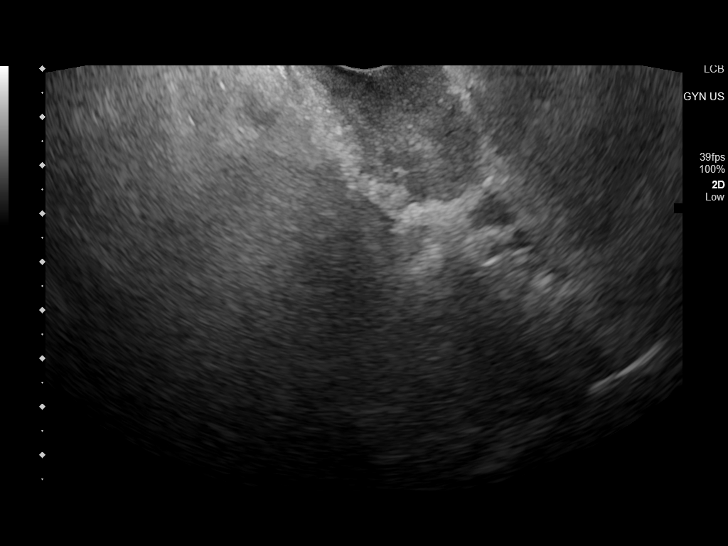
[im 55/83]
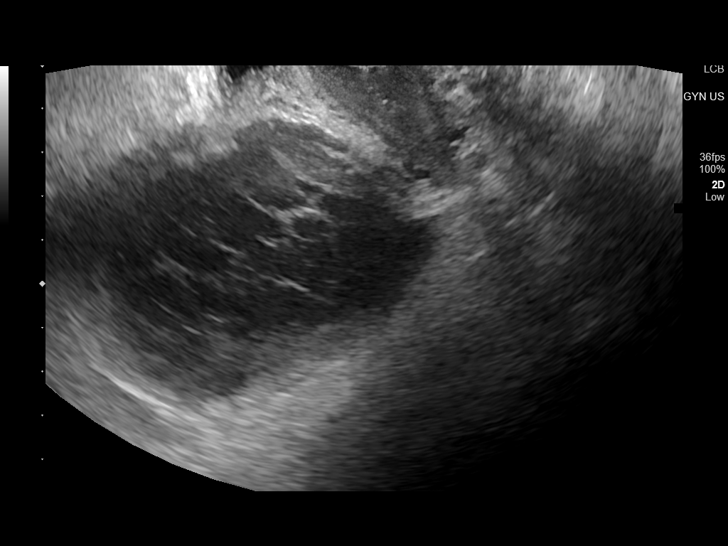
[im 62/83]
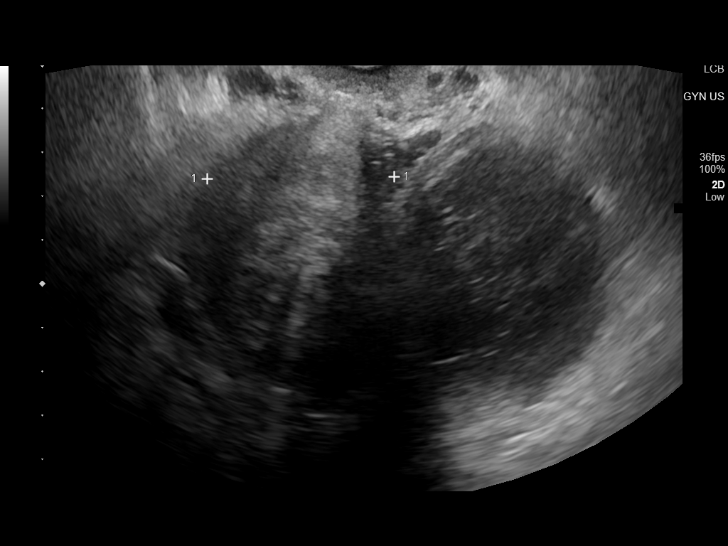
[im 69/83]
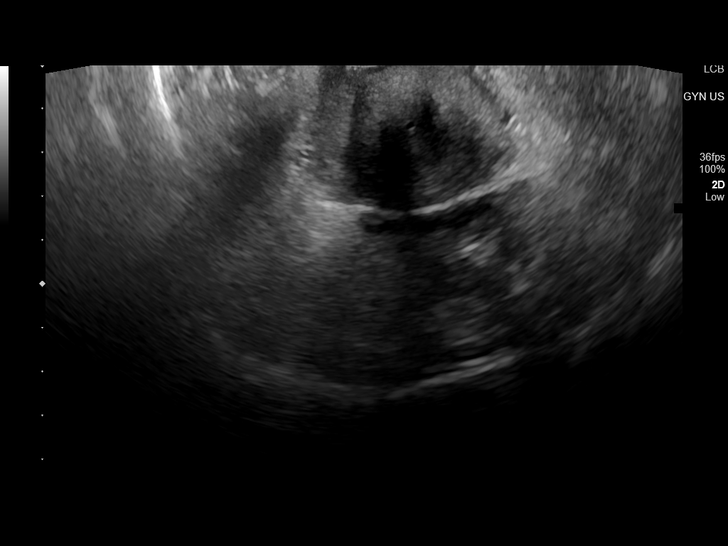
[im 76/83]
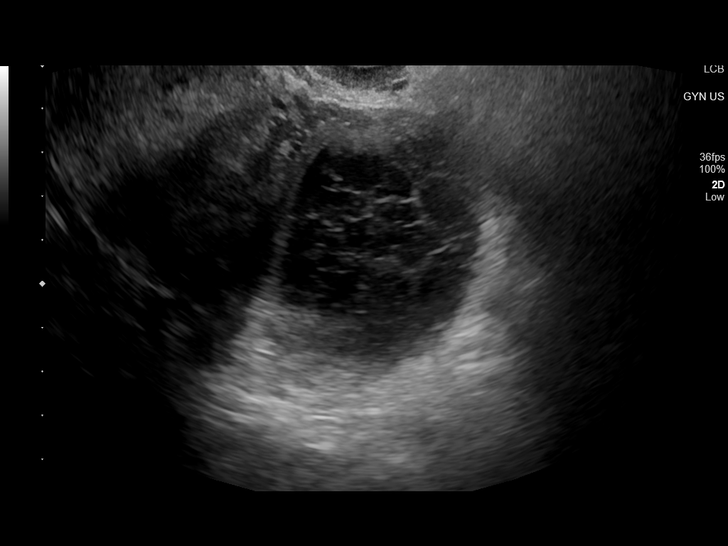
[im 83/83]
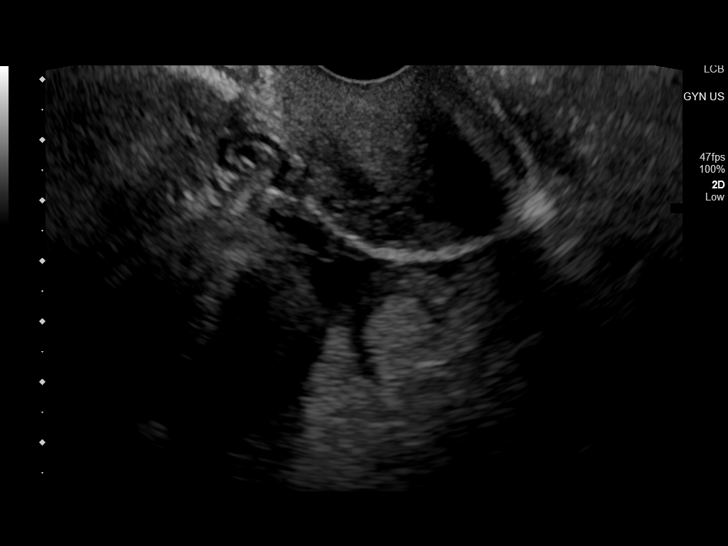

[13 of 25 positions shown; findings below may reference images not displayed]

FINDINGS: Uterus

Measurements: 10.3 x 4.3 x 4.3 cm = volume: 100 mL. There is a
cm fibroid.

Endometrium

Thickness: 4 mm.  No focal abnormality visualized.

Right ovary

Measurements: 2.8 x 2 x 1.7 cm = volume: 5 mL. Normal appearance/no
adnexal mass.

Left ovary

Measurements: 8 x 6.1 x 4.7 cm = volume: 120 mL. There is a complex
cyst measuring 6.9 x 5.7 x 3.9 cm. This cystic mass contains
lace-like internal echoes but no significant internal color Doppler
flow.

Pulsed Doppler evaluation of both ovaries demonstrates normal
low-resistance arterial and venous waveforms.

Other findings

No abnormal free fluid.
IMPRESSION: 1. Fibroid uterus.
2. There is a complex cystic mass involving the left ovary measuring
up to approximately 6.9 cm. This is favored to represent a
hemorrhagic cyst. Recommend follow-up US in 3-6 months. Note: This
recommendation does not apply to premenarchal patients or to those
with increased risk (genetic, family history, elevated tumor markers
or other high-risk factors) of ovarian cancer. Reference: Radiology
[DATE]):[id number].

## 2021-09-12 ENCOUNTER — Ambulatory Visit: Payer: Medicaid Other

## 2021-10-30 IMAGING — CR DG CHEST 2V
2 series · 2 of 2 positions shown · non-contrast
Comparison: Chest x-ray report 09/02/2003.

CLINICAL DATA: Cough.

EXAM:
CHEST - 2 VIEW

[chest pa]
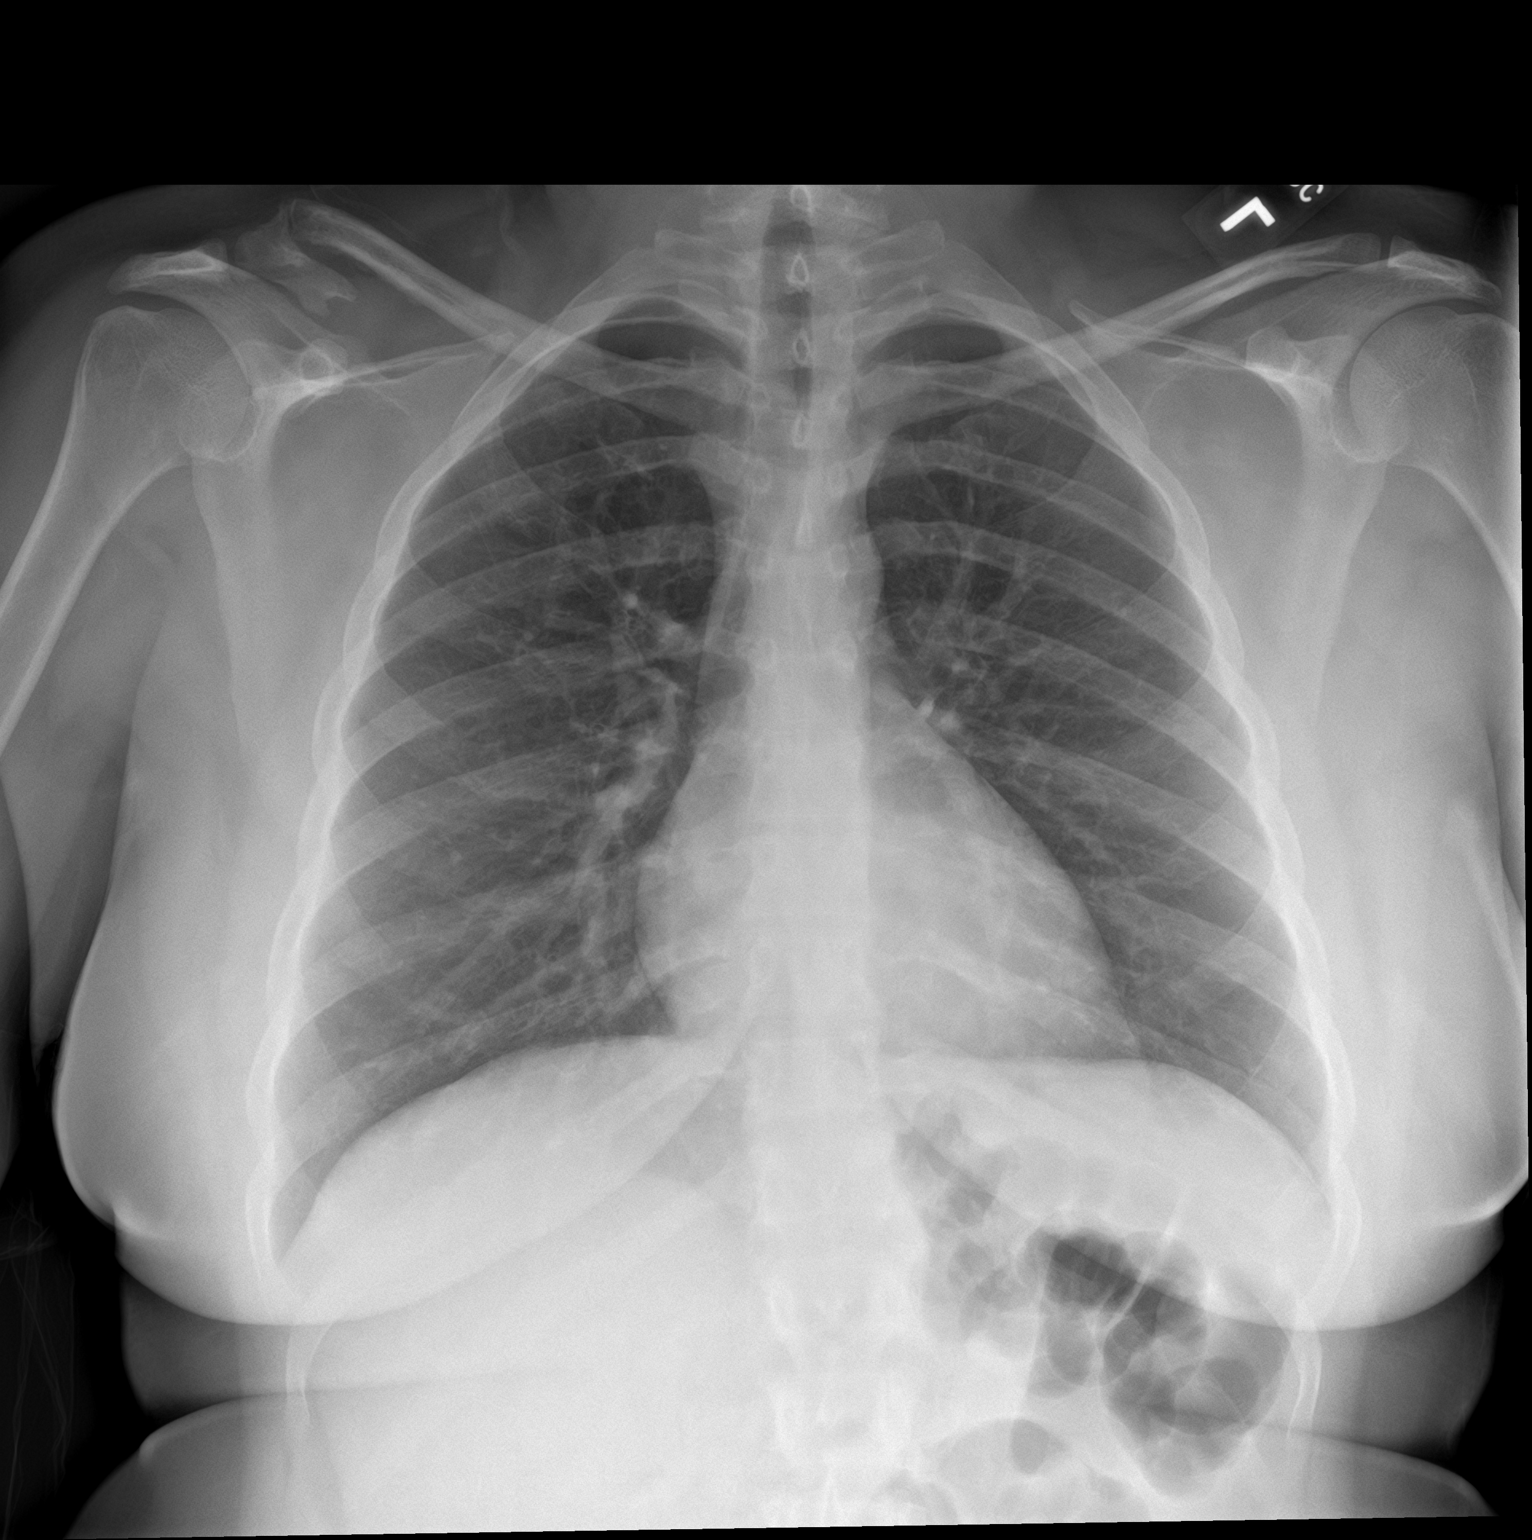

[chest lat]
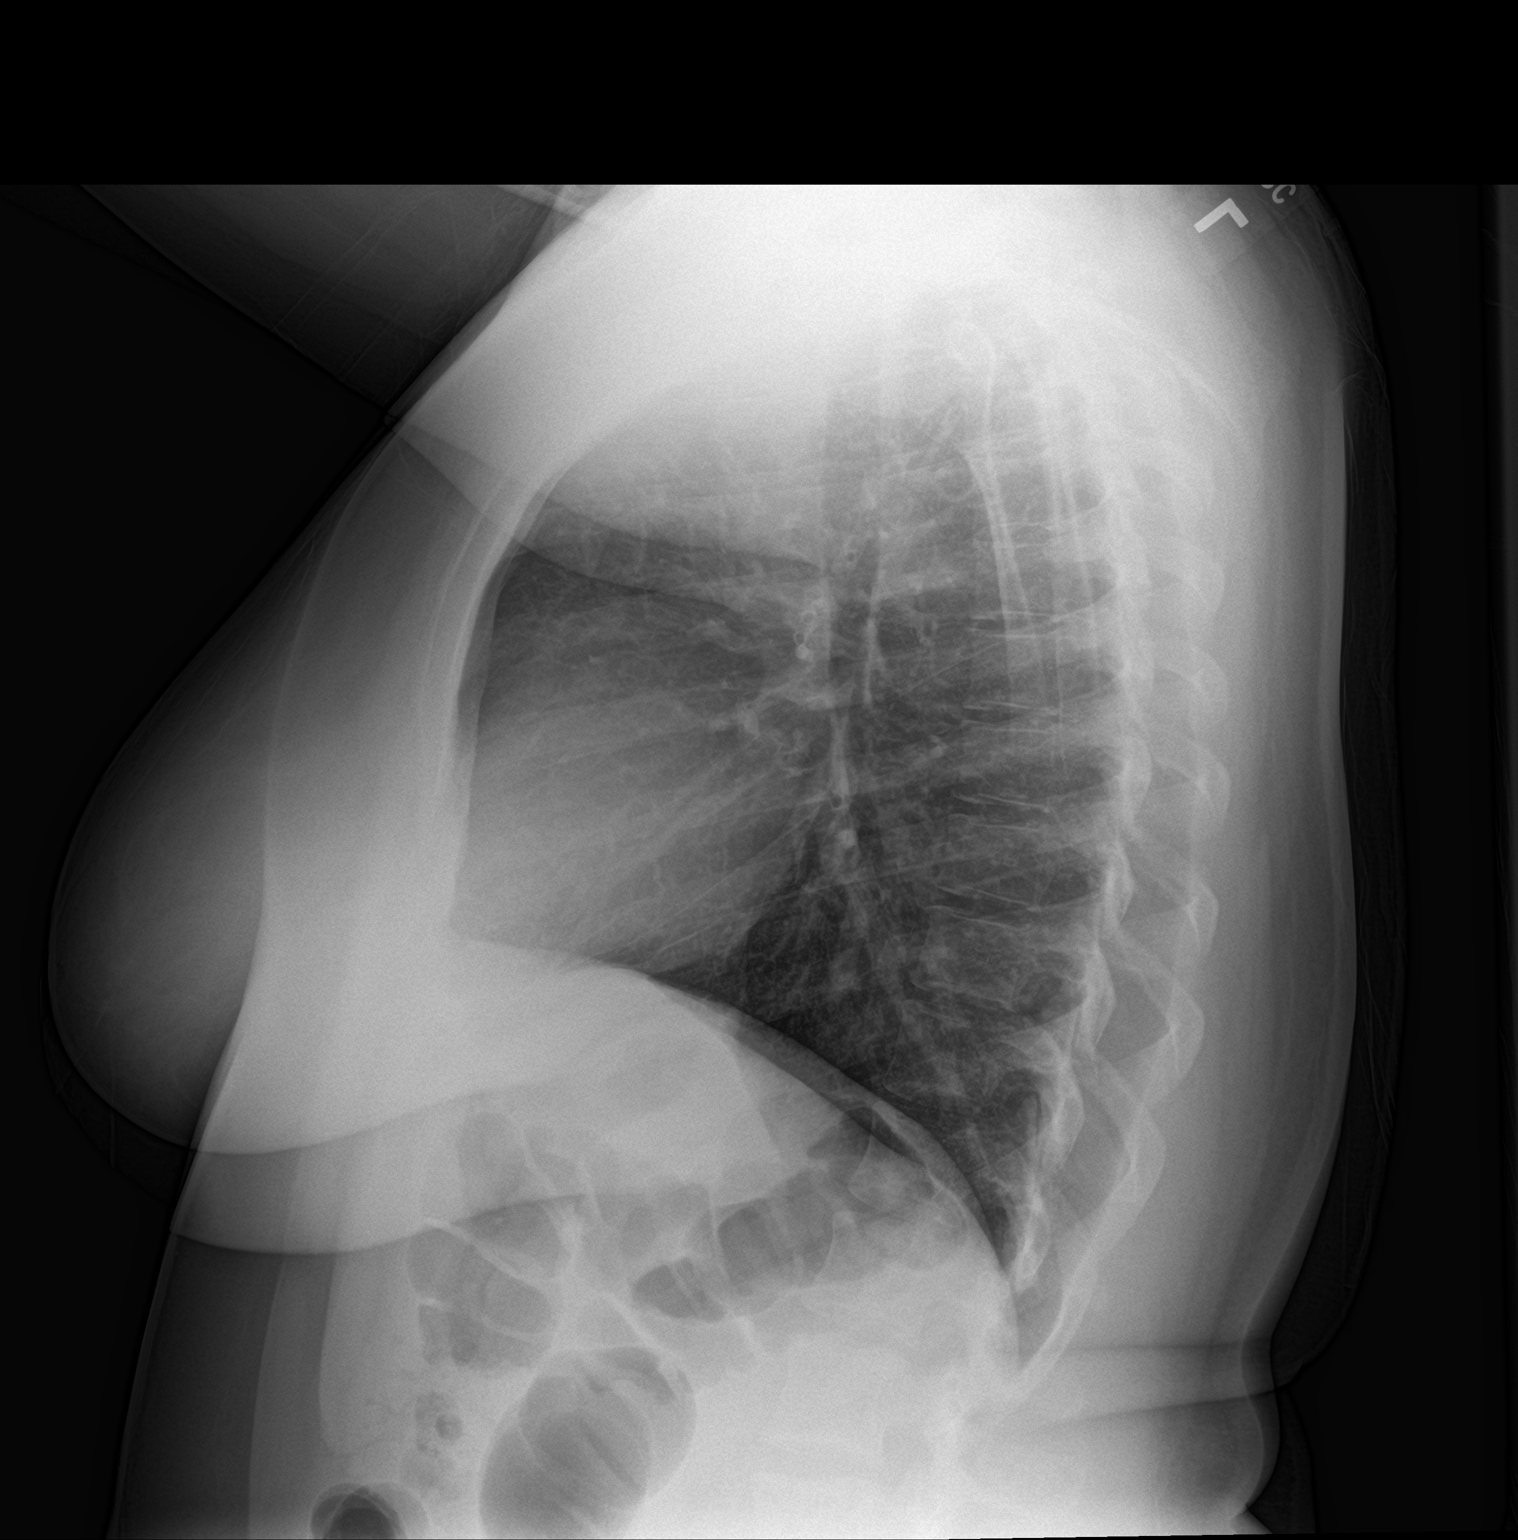

[2 of 2 positions shown; findings below may reference images not displayed]

FINDINGS: Mediastinum hilar structures normal. Lungs are clear. No focal
infiltrate. No pleural effusion or pneumothorax. Heart size normal.
IMPRESSION: No acute cardiopulmonary disease.

## 2021-12-31 ENCOUNTER — Ambulatory Visit: Payer: Medicaid Other

## 2022-02-24 ENCOUNTER — Encounter: Payer: Self-pay | Admitting: Emergency Medicine

## 2022-02-24 ENCOUNTER — Other Ambulatory Visit: Payer: Self-pay

## 2022-02-24 ENCOUNTER — Emergency Department
Admission: EM | Admit: 2022-02-24 | Discharge: 2022-02-24 | Disposition: A | Discharge status: Home / Self Care | Payer: Self-pay | Attending: Emergency Medicine | Admitting: Emergency Medicine

## 2022-02-24 DIAGNOSIS — J069 Acute upper respiratory infection, unspecified: Secondary | ICD-10-CM

## 2022-02-24 DIAGNOSIS — I10 Essential (primary) hypertension: Secondary | ICD-10-CM | POA: Insufficient documentation

## 2022-02-24 MED ORDER — AZITHROMYCIN 250 MG PO TABS: ORAL_TABLET | ORAL | 0 refills | Status: DC

## 2022-02-24 MED ORDER — HYDROCODONE BIT-HOMATROP MBR 5-1.5 MG/5ML PO SOLN: 5.0000 mL | Freq: Four times a day (QID) | ORAL | 0 refills | Status: DC | PRN

## 2022-02-24 MED ORDER — FLUTICASONE PROPIONATE 50 MCG/ACT NA SUSP: 2.0000 | Freq: Every day | NASAL | 2 refills | Status: DC

## 2022-02-24 MED ORDER — BENZONATATE 100 MG PO CAPS: 100.0000 mg | ORAL_CAPSULE | Freq: Three times a day (TID) | ORAL | 0 refills | Status: DC | PRN

## 2022-02-24 NOTE — ED Triage Notes (Signed)
Pt c/o sinus and chest congestion for the past 2 weeks and today having vomiting with it. Pt is in NAD, denies diarrhea or fever.

## 2022-02-24 NOTE — ED Notes (Signed)
4 yom with a c/c of cough and congestion for 2 weeks. The pt also stated she has started vomiting this morning.

## 2022-02-24 NOTE — ED Provider Notes (Signed)
[facility] Provider Note    Event Date/Time   First MD Initiated Contact with Patient 02/24/22 [phone]     (approximate)   History   Cough   HPI  Sue Hall is a 36 y.o. female with history of hypertension presents emergency department complaining of cough and congestion for 2 weeks.  Patient states she has used multiple over-the-counter medications without any relief.  Is not getting any rest at home due to the cough.  States mucus is brown.  No fever or chills.  No chest pain or shortness of breath.      Physical Exam   Triage Vital Signs: ED Triage Vitals  Enc Vitals Group     BP 02/24/22 0711 (!) 156/110     Pulse Rate 02/24/22 0711 95     Resp 02/24/22 0711 16     Temp 02/24/22 0711 98.5 F (36.9 C)     Temp Source 02/24/22 0711 Oral     SpO2 02/24/22 0711 97 %     Weight 02/24/22 0709 196 lb (88.9 kg)     Height 02/24/22 0709 '5\' 3"'$  (1.6 m)     Head Circumference --      Peak Flow --      Pain Score 02/24/22 0709 0     Pain Loc --      Pain Edu? --      Excl. in [place]? --     Most recent vital signs: Vitals:   02/24/22 0711  BP: (!) 156/110  Pulse: 95  Resp: 16  Temp: 98.5 F (36.9 C)  SpO2: 97%     General: Awake, no distress.   CV:  Good peripheral perfusion. regular rate and  rhythm Resp:  Normal effort. Lungs CTA Abd:  No distention.   Other:      ED Results / Procedures / Treatments   Labs (all labs ordered are listed, but only abnormal results are displayed) Labs Reviewed - No data to display   EKG     RADIOLOGY     PROCEDURES:   Procedures   MEDICATIONS ORDERED IN ED: Medications - No data to display   IMPRESSION / MDM / [place] / ED COURSE  I reviewed the triage vital signs and the nursing notes.                              Differential diagnosis includes, but is not limited to, sinusitis, URI, COVID, CAP  I feel like covid is less likely at this time as she has been  sick for 2 weeks.  Do not feel that testing her for COVID today will change any treatment plan.  She is out of the quarantine window.  Patient does seem to have a lot of nasal congestion, will place her on a Z-Pak, Flonase, Tessalon Perles and Hycodan cough syrup for at night.  Patient is in agreement with treatment plan.  She was given a work note and discharged stable condition.         FINAL CLINICAL IMPRESSION(S) / ED DIAGNOSES   Final diagnoses:  Acute URI     Rx / DC Orders   ED Discharge Orders          Ordered    azithromycin (ZITHROMAX Z-PAK) 250 MG tablet        02/24/22 0738    benzonatate (TESSALON PERLES) 100 MG capsule  3 times daily  PRN        02/24/22 0738    fluticasone (FLONASE) 50 MCG/ACT nasal spray  Daily        02/24/22 0738    HYDROcodone bit-homatropine (HYCODAN) 5-1.5 MG/5ML syrup  Every 6 hours PRN        02/24/22 0881             Note:  This document was prepared using Dragon voice recognition software and may include unintentional dictation errors.    Versie Starks, PA-C 02/24/22 1031    Blake Divine, MD 02/24/22 [phone]

## 2022-04-08 ENCOUNTER — Ambulatory Visit: Payer: Medicaid Other

## 2022-07-31 ENCOUNTER — Encounter: Payer: Self-pay | Admitting: Emergency Medicine

## 2022-07-31 ENCOUNTER — Emergency Department: Payer: Self-pay

## 2022-07-31 ENCOUNTER — Other Ambulatory Visit: Payer: Self-pay

## 2022-07-31 ENCOUNTER — Emergency Department
Admission: EM | Admit: 2022-07-31 | Discharge: 2022-07-31 | Disposition: A | Discharge status: Home / Self Care | Payer: Self-pay | Attending: Emergency Medicine | Admitting: Emergency Medicine

## 2022-07-31 DIAGNOSIS — J209 Acute bronchitis, unspecified: Secondary | ICD-10-CM | POA: Insufficient documentation

## 2022-07-31 DIAGNOSIS — Z1152 Encounter for screening for COVID-19: Secondary | ICD-10-CM | POA: Insufficient documentation

## 2022-07-31 DIAGNOSIS — I10 Essential (primary) hypertension: Secondary | ICD-10-CM | POA: Insufficient documentation

## 2022-07-31 DIAGNOSIS — F17200 Nicotine dependence, unspecified, uncomplicated: Secondary | ICD-10-CM | POA: Insufficient documentation

## 2022-07-31 LAB — RESP PANEL BY RT-PCR (FLU A&B, COVID) ARPGX2
Influenza A by PCR: NEGATIVE
Influenza B by PCR: NEGATIVE
SARS Coronavirus 2 by RT PCR: NEGATIVE

## 2022-07-31 MED ORDER — PREDNISONE 10 MG (21) PO TBPK: ORAL_TABLET | ORAL | 0 refills | Status: DC

## 2022-07-31 MED ORDER — PSEUDOEPH-BROMPHEN-DM 30-2-10 MG/5ML PO SYRP: 5.0000 mL | ORAL_SOLUTION | Freq: Three times a day (TID) | ORAL | 0 refills | Status: DC | PRN

## 2022-07-31 MED ORDER — AZITHROMYCIN 250 MG PO TABS: ORAL_TABLET | ORAL | 0 refills | Status: DC

## 2022-07-31 NOTE — Discharge Instructions (Signed)
Follow up with your regular doctor if not better in 3 days, return if worsening Take the medication as prescribed

## 2022-07-31 NOTE — ED Triage Notes (Signed)
Pt here with flu-like symptoms that started Wed. Pt states she is having sneezing, coughing, and congestion. Pt states pain in her chest and abd. Pt denies fever at home.

## 2022-07-31 NOTE — ED Provider Notes (Signed)
[facility] Provider Note    Event Date/Time   First MD Initiated Contact with Patient 07/31/22 1352     (approximate)   History   Cough   HPI  Sue Hall is a 36 y.o. female with history of hypertension and tachycardia presents emergency department complaining of flulike symptoms since Wednesday.  She has had sneezing, cough, congestion.  Mucus has a little yellow tinge to it only in the morning and late at night.  Has been clear all day today.  Patient states that she is a smoker.  Cough is very deep and congested.  Has a niece and nephew 22 [place] who have been sick for last few days.  Her last workplace had 6 people that were positive for COVID and she states she has been gone from there for close to 2 weeks.      Physical Exam   Triage Vital Signs: ED Triage Vitals  Enc Vitals Group     BP 07/31/22 1330 (!) 170/118     Pulse Rate 07/31/22 1330 (!) 114     Resp 07/31/22 1330 18     Temp 07/31/22 1330 97.8 F (36.6 C)     Temp Source 07/31/22 1330 Oral     SpO2 07/31/22 1330 98 %     Weight 07/31/22 1331 114 lb (51.7 kg)     Height 07/31/22 1331 '5\' 3"'$  (1.6 m)     Head Circumference --      Peak Flow --      Pain Score 07/31/22 1330 6     Pain Loc --      Pain Edu? --      Excl. in [place]? --     Most recent vital signs: Vitals:   07/31/22 1330  BP: (!) 170/118  Pulse: (!) 114  Resp: 18  Temp: 97.8 F (36.6 C)  SpO2: 98%     General: Awake, no distress.   CV:  Good peripheral perfusion.  Tachycardic and  rhythm Resp:  Normal effort. Lungs CTA, cough is deep and congested Abd:  No distention.   Other:      ED Results / Procedures / Treatments   Labs (all labs ordered are listed, but only abnormal results are displayed) Labs Reviewed  RESP PANEL BY RT-PCR (FLU A&B, COVID) ARPGX2     EKG     RADIOLOGY Chest x-ray    PROCEDURES:   Procedures   MEDICATIONS ORDERED IN ED: Medications - No data to  display   IMPRESSION / MDM / [place] / ED COURSE  I reviewed the triage vital signs and the nursing notes.                              Differential diagnosis includes, but is not limited to, CAP, COVID, influenza, acute bronchitis  Patient's presentation is most consistent with acute complicated illness / injury requiring diagnostic workup.   Chest x-ray, respiratory panel ordered   Chest x-ray independently reviewed and interpreted by me as being negative, respiratory panel is also negative  I did discuss this finding with the patient.  She be placed on a Z-Pak, steroid pack, and Bromfed cough syrup.  She is to follow-up with her regular doctor if not improving 3 days.  Return emergency department.  Patient states she always has an elevated heart rate.  She is to monitor her heart rate.  If increasing she  is to stop the prednisone and the Bromfed cough syrup.  She is in agreement with treatment plan.  Discharged in stable condition.   FINAL CLINICAL IMPRESSION(S) / ED DIAGNOSES   Final diagnoses:  Acute bronchitis, unspecified organism     Rx / DC Orders   ED Discharge Orders          Ordered    azithromycin (ZITHROMAX Z-PAK) 250 MG tablet        07/31/22 1511    predniSONE (STERAPRED UNI-PAK 21 TAB) 10 MG (21) TBPK tablet        07/31/22 1511    brompheniramine-pseudoephedrine-DM 30-2-10 MG/5ML syrup  3 times daily PRN        07/31/22 1511             Note:  This document was prepared using Dragon voice recognition software and may include unintentional dictation errors.    Versie Starks, PA-C 07/31/22 1521    Duffy Bruce, MD 08/01/22 [phone]

## 2022-08-21 ENCOUNTER — Ambulatory Visit: Payer: Medicaid Other

## 2022-09-16 ENCOUNTER — Emergency Department
Admission: EM | Admit: 2022-09-16 | Discharge: 2022-09-17 | Disposition: A | Discharge status: Home / Self Care | Payer: Medicaid Other | Attending: Emergency Medicine | Admitting: Emergency Medicine

## 2022-09-16 ENCOUNTER — Encounter: Payer: Self-pay | Admitting: Emergency Medicine

## 2022-09-16 DIAGNOSIS — B974 Respiratory syncytial virus as the cause of diseases classified elsewhere: Secondary | ICD-10-CM | POA: Diagnosis not present

## 2022-09-16 DIAGNOSIS — Z20822 Contact with and (suspected) exposure to covid-19: Secondary | ICD-10-CM | POA: Diagnosis not present

## 2022-09-16 DIAGNOSIS — J020 Streptococcal pharyngitis: Secondary | ICD-10-CM | POA: Diagnosis not present

## 2022-09-16 DIAGNOSIS — B338 Other specified viral diseases: Secondary | ICD-10-CM

## 2022-09-16 DIAGNOSIS — J029 Acute pharyngitis, unspecified: Secondary | ICD-10-CM | POA: Diagnosis present

## 2022-09-16 LAB — GROUP A STREP BY PCR: Group A Strep by PCR: DETECTED — AB

## 2022-09-16 LAB — RESP PANEL BY RT-PCR (RSV, FLU A&B, COVID)  RVPGX2
Influenza A by PCR: NEGATIVE
Influenza B by PCR: NEGATIVE
Resp Syncytial Virus by PCR: POSITIVE — AB
SARS Coronavirus 2 by RT PCR: NEGATIVE

## 2022-09-16 NOTE — ED Triage Notes (Signed)
Pt presents via POV with complaints of sore throat and nasal congestion since last Friday. Pt states that her throat. No meds taken PTA. Airway patent - respirations equal and unlabored. Denies CP or SOB.

## 2022-09-17 MED ORDER — DEXAMETHASONE 10 MG/ML FOR PEDIATRIC ORAL USE: 10.0000 mg | Freq: Once | INTRAMUSCULAR | Status: DC

## 2022-09-17 MED ORDER — PENICILLIN G BENZATHINE 1200000 UNIT/2ML IM SUSY
1.2000 10*6.[IU] | PREFILLED_SYRINGE | Freq: Once | INTRAMUSCULAR | Status: AC
  Administered 2022-09-17: 1.2 10*6.[IU] via INTRAMUSCULAR
  Filled 2022-09-17: qty 2

## 2022-09-17 MED ORDER — DEXAMETHASONE 10 MG/ML FOR PEDIATRIC ORAL USE
10.0000 mg | Freq: Once | INTRAMUSCULAR | Status: AC
  Administered 2022-09-17: 10 mg via ORAL
  Filled 2022-09-17: qty 1

## 2022-09-17 NOTE — ED Notes (Signed)
Pt Dc to home. Dc instructions reviewed with all questions answered. Pt voices understanding. Pt ambulatory out of dept with steady gait with family member.

## 2022-09-17 NOTE — Discharge Instructions (Addendum)
Take acetaminophen 650 mg and ibuprofen 400 mg every 6 hours for pain.  Take with food.  Thank you for choosing us for your health care today!  Please see your primary doctor this week for a follow up appointment.   Sometimes, in the early stages of certain disease courses it is difficult to detect in the emergency department evaluation -- so, it is important that you continue to monitor your symptoms and call your doctor right away or return to the emergency department if you develop any new or worsening symptoms.  Please go to the following website to schedule new (and existing) patient appointments:   [email or web address]  If you do not have a primary doctor try calling the following clinics to establish care:  If you have insurance:  [facility] [phone] [address] [place] [address]   [facility]  [phone] [address] [place] [address] [place] [address]   If you do not have insurance:  [facility]  [phone] [address] [place] [address]   The following is another list of primary care offices in the area who are accepting new patients at this time.  Please reach out to one of them directly and let them know you would like to schedule an appointment to follow up on an Emergency Department visit, and/or to establish a new primary care provider (PCP).  There are likely other primary care clinics in the are who are accepting new patients, but this is an excellent place to start:  [facility] Lead physician: Dr Angela Bacigalupo [address] [place], [place] [address] [phone]  [facility] Lead Physician: Dr Krichna Sowles [address], [place], [place] [address] [phone]  [facility]  Lead Physician: Dr Megan Johnson [address], [place], [place] [address] [phone]  [facility] Lead Physician: Dr Alex  Karamalegos [address], [place] [address] [phone]  [facility] [place] Lead Physician: Dr Laura Berglund [address], [place], [place] [address] [phone]   It was my pleasure to care for you today.   Quinnlan Abruzzo S. Griffey Nicasio, MD  

## 2022-09-17 NOTE — ED Provider Notes (Signed)
[facility] Provider Note    Event Date/Time   First MD Initiated Contact with Patient 09/16/22 2356     (approximate)   History   Sore Throat   HPI  Sue Hall is a 36 y.o. female   Past medical history of pretension who presents with about 5 days of cough, congestion, myalgias and sore throat.  Her sore throat is severe.  She reports no fever.  She has not been taking medications at home.   She has no chest pain or shortness of breath. She offers no other acute medical complaints. History was obtained via the patient. Her friend is available at bedside to corroborate information given above is an independent historian.      Physical Exam   Triage Vital Signs: ED Triage Vitals  Enc Vitals Group     BP 09/16/22 2237 (!) 152/92     Pulse Rate 09/16/22 2237 100     Resp 09/16/22 2237 20     Temp 09/16/22 2237 98.5 F (36.9 C)     Temp Source 09/16/22 2237 Oral     SpO2 09/16/22 2237 100 %     Weight 09/16/22 2235 190 lb (86.2 kg)     Height 09/16/22 2235 '5\' 3"'$  (1.6 m)     Head Circumference --      Peak Flow --      Pain Score 09/16/22 2235 10     Pain Loc --      Pain Edu? --      Excl. in [place]? --     Most recent vital signs: Vitals:   09/16/22 2237  BP: (!) 152/92  Pulse: 100  Resp: 20  Temp: 98.5 F (36.9 C)  SpO2: 100%    General: Awake, no distress.  CV:  Good peripheral perfusion.  Resp:  Normal effort.  Abd:  No distention.  Other:  Posterior oropharynx has erythema no exudates no masses and uvula is midline.  Her voice is normal, maintaining secretions neck is supple with full range of motion no obvious cervical lymphadenopathy.  Lungs are clear abdomen soft and nontender and she appears nontoxic and comfortable.   ED Results / Procedures / Treatments   Labs (all labs ordered are listed, but only abnormal results are displayed) Labs Reviewed  RESP PANEL BY RT-PCR (RSV, FLU A&B, COVID)  RVPGX2 - Abnormal;  Notable for the following components:      Result Value   Resp Syncytial Virus by PCR POSITIVE (*)    All other components within normal limits  GROUP A STREP BY PCR - Abnormal; Notable for the following components:   Group A Strep by PCR DETECTED (*)    All other components within normal limits     I reviewed labs and they are notable for +RSV, +strep   PROCEDURES:  Critical Care performed: No  Procedures   MEDICATIONS ORDERED IN ED: Medications  dexamethasone (DECADRON) 10 MG/ML injection for Pediatric ORAL use 10 mg (has no administration in time range)  penicillin g benzathine (BICILLIN LA) 1200000 UNIT/2ML injection 1.2 Million Units (has no administration in time range)    IMPRESSION / MDM / ASSESSMENT AND PLAN / ED COURSE  I reviewed the triage vital signs and the nursing notes.                              Differential diagnosis includes, but is not limited to,  strep pharyngitis, deep space neck infection, abscess, viral URI, bacterial pneumonia, sepsis   MDM: Patient with symptoms of viral URI who appears well, exam benign as above who tested positive for both RSV and strep pharyngitis.  She will receive IM penicillin and dexamethasone, anticipatory guidance and follow-up with PMD.   Patient's presentation is most consistent with acute presentation with potential threat to life or bodily function.       FINAL CLINICAL IMPRESSION(S) / ED DIAGNOSES   Final diagnoses:  Sore throat  RSV (respiratory syncytial virus infection)  Strep pharyngitis     Rx / DC Orders   ED Discharge Orders     None        Note:  This document was prepared using Dragon voice recognition software and may include unintentional dictation errors.    Lucillie Garfinkel, MD 09/17/22 [phone]

## 2023-02-07 ENCOUNTER — Emergency Department
Admission: EM | Admit: 2023-02-07 | Discharge: 2023-02-07 | Disposition: A | Discharge status: Home / Self Care | Payer: Medicaid Other | Attending: Emergency Medicine | Admitting: Emergency Medicine

## 2023-02-07 ENCOUNTER — Other Ambulatory Visit: Payer: Self-pay

## 2023-02-07 DIAGNOSIS — H6123 Impacted cerumen, bilateral: Secondary | ICD-10-CM | POA: Diagnosis not present

## 2023-02-07 DIAGNOSIS — H6122 Impacted cerumen, left ear: Secondary | ICD-10-CM

## 2023-02-07 DIAGNOSIS — H918X2 Other specified hearing loss, left ear: Secondary | ICD-10-CM | POA: Diagnosis present

## 2023-02-07 NOTE — Discharge Instructions (Signed)
Use debrox once a week to keep the wax soft so it will come out easily when in the shower Follow up with your regular doctor as needed

## 2023-02-07 NOTE — ED Provider Notes (Signed)
[facility] Provider Note    Event Date/Time   First MD Initiated Contact with Patient 02/07/23 0725     (approximate)   History   Ear Fullness   HPI  Sue Hall is a 37 y.o. female with no significant past medical history presents emergency department with decreased hearing in the left ear.  States feels very full.  Symptoms been ongoing since March.      Physical Exam   Triage Vital Signs: ED Triage Vitals  Enc Vitals Group     BP 02/07/23 0645 (!) 151/95     Pulse Rate 02/07/23 0645 81     Resp 02/07/23 0645 17     Temp 02/07/23 0645 98 F (36.7 C)     Temp Source 02/07/23 0645 Oral     SpO2 02/07/23 0645 97 %     Weight 02/07/23 0647 175 lb (79.4 kg)     Height 02/07/23 0646 5\' 3"  (1.6 m)     Head Circumference --      Peak Flow --      Pain Score 02/07/23 0646 0     Pain Loc --      Pain Edu? --      Excl. in [place]? --     Most recent vital signs: Vitals:   02/07/23 0645  BP: (!) 151/95  Pulse: 81  Resp: 17  Temp: 98 F (36.7 C)  SpO2: 97%     General: Awake, no distress.   CV:  Good peripheral perfusion. regular rate and  rhythm Resp:  Normal effort.  Abd:  No distention.   Other:  Both ear canals have cerumen impactions   ED Results / Procedures / Treatments   Labs (all labs ordered are listed, but only abnormal results are displayed) Labs Reviewed - No data to display   EKG     RADIOLOGY     PROCEDURES:   .Ear Cerumen Removal  Date/Time: 02/07/2023 8:13 AM  Performed by: Faythe Ghee, PA-C Authorized by: Faythe Ghee, PA-C   Consent:    Consent obtained:  Verbal   Consent given by:  Patient   Risks, benefits, and alternatives were discussed: yes     Risks discussed:  Bleeding, infection, pain, TM perforation, incomplete removal and dizziness   Alternatives discussed:  Referral Universal protocol:    Procedure explained and questions answered to patient or proxy's satisfaction: yes      Immediately prior to procedure, a time out was called: yes     Patient identity confirmed:  Verbally with patient Procedure details:    Location:  L ear   Procedure type: irrigation     Procedure outcomes: cerumen removed   Post-procedure details:    Inspection:  TM intact   Hearing quality:  Normal   Procedure completion:  Tolerated well, no immediate complications .Ear Cerumen Removal  Date/Time: 02/07/2023 8:14 AM  Performed by: Faythe Ghee, PA-C Authorized by: Faythe Ghee, PA-C   Consent:    Consent obtained:  Verbal   Consent given by:  Patient   Risks, benefits, and alternatives were discussed: yes     Risks discussed:  Bleeding, infection, pain, TM perforation, incomplete removal and dizziness   Alternatives discussed:  No treatment Universal protocol:    Procedure explained and questions answered to patient or proxy's satisfaction: yes     Immediately prior to procedure, a time out was called: yes     Patient identity confirmed:  Verbally with patient Procedure details:    Location:  R ear   Procedure type: irrigation     Procedure outcomes: cerumen removed   Post-procedure details:    Inspection:  TM intact   Hearing quality:  Normal   Procedure completion:  Tolerated well, no immediate complications    MEDICATIONS ORDERED IN ED: Medications - No data to display   IMPRESSION / MDM / ASSESSMENT AND PLAN / ED COURSE  I reviewed the triage vital signs and the nursing notes.                              Differential diagnosis includes, but is not limited to, cerumen impaction, otitis media, hearing loss  Patient's presentation is most consistent with acute complicated illness / injury requiring diagnostic workup.   Patient has a bilateral cerumen impaction.  Will irrigate both ears to remove the wax.  If unable to remove wax patient will need follow-up with ENT.  See procedure note for cerumen removal.  I was able to remove the cerumen in both ear  canals.  Ear canals are normal there is no bleeding, TMs are intact, patient states she feels much better.  She was encouraged to use Debrox weekly.  Follow-up with her regular doctor as needed.  Return if worsening.  Discharged stable condition.      FINAL CLINICAL IMPRESSION(S) / ED DIAGNOSES   Final diagnoses:  Hearing loss of left ear due to cerumen impaction     Rx / DC Orders   ED Discharge Orders     None        Note:  This document was prepared using Dragon voice recognition software and may include unintentional dictation errors.    Faythe Ghee, PA-C 02/07/23 0815    Merwyn Katos, MD 02/07/23 [phone]

## 2023-02-07 NOTE — ED Triage Notes (Signed)
Pt arrives via POV with CC of decreased hearing and fullness in L ear that has been ongoing for the last month and a half. Pt here to drop off mother to be seen - pt reports not being able to get primary appointment until June.

## 2023-04-13 ENCOUNTER — Ambulatory Visit: Payer: Medicaid Other

## 2023-05-03 ENCOUNTER — Other Ambulatory Visit: Payer: Self-pay

## 2023-05-03 ENCOUNTER — Encounter: Payer: Self-pay | Admitting: Emergency Medicine

## 2023-05-03 ENCOUNTER — Emergency Department
Admission: EM | Admit: 2023-05-03 | Discharge: 2023-05-04 | Disposition: A | Discharge status: Home / Self Care | Payer: Medicaid Other | Attending: Emergency Medicine | Admitting: Emergency Medicine

## 2023-05-03 ENCOUNTER — Emergency Department: Payer: Medicaid Other

## 2023-05-03 DIAGNOSIS — N938 Other specified abnormal uterine and vaginal bleeding: Secondary | ICD-10-CM | POA: Diagnosis present

## 2023-05-03 DIAGNOSIS — I10 Essential (primary) hypertension: Secondary | ICD-10-CM | POA: Diagnosis not present

## 2023-05-03 LAB — CBC WITH DIFFERENTIAL/PLATELET
Abs Immature Granulocytes: 0.03 10*3/uL (ref 0.00–0.07)
Basophils Absolute: 0.1 10*3/uL (ref 0.0–0.1)
Basophils Relative: 1 %
Eosinophils Absolute: 0.2 10*3/uL (ref 0.0–0.5)
Eosinophils Relative: 2 %
HCT: 36.4 % (ref 36.0–46.0)
Hemoglobin: 11.1 g/dL — ABNORMAL LOW (ref 12.0–15.0)
Immature Granulocytes: 0 %
Lymphocytes Relative: 20 %
Lymphs Abs: 1.7 10*3/uL (ref 0.7–4.0)
MCH: 24.4 pg — ABNORMAL LOW (ref 26.0–34.0)
MCHC: 30.5 g/dL (ref 30.0–36.0)
MCV: 80 fL (ref 80.0–100.0)
Monocytes Absolute: 0.4 10*3/uL (ref 0.1–1.0)
Monocytes Relative: 5 %
Neutro Abs: 6.2 10*3/uL (ref 1.7–7.7)
Neutrophils Relative %: 72 %
Platelets: 385 10*3/uL (ref 150–400)
RBC: 4.55 MIL/uL (ref 3.87–5.11)
RDW: 14.6 % (ref 11.5–15.5)
WBC: 8.6 10*3/uL (ref 4.0–10.5)
nRBC: 0 % (ref 0.0–0.2)

## 2023-05-03 LAB — COMPREHENSIVE METABOLIC PANEL
ALT: 26 U/L (ref 0–44)
AST: 26 U/L (ref 15–41)
Albumin: 4.1 g/dL (ref 3.5–5.0)
Alkaline Phosphatase: 73 U/L (ref 38–126)
Anion gap: 7 (ref 5–15)
BUN: 9 mg/dL (ref 6–20)
CO2: 25 mmol/L (ref 22–32)
Calcium: 9.7 mg/dL (ref 8.9–10.3)
Chloride: 105 mmol/L (ref 98–111)
Creatinine, Ser: 0.63 mg/dL (ref 0.44–1.00)
GFR, Estimated: >60 mL/min (ref >60)
Glucose, Bld: 141 mg/dL — ABNORMAL HIGH (ref 70–99)
Potassium: 3.7 mmol/L (ref 3.5–5.1)
Sodium: 137 mmol/L (ref 135–145)
Total Bilirubin: 0.9 mg/dL (ref 0.3–1.2)
Total Protein: 8.4 g/dL — ABNORMAL HIGH (ref 6.5–8.1)

## 2023-05-03 LAB — URINALYSIS, ROUTINE W REFLEX MICROSCOPIC
Bacteria, UA: NONE SEEN
Bilirubin Urine: NEGATIVE
Glucose, UA: NEGATIVE mg/dL
Ketones, ur: NEGATIVE mg/dL
Leukocytes,Ua: NEGATIVE
Nitrite: NEGATIVE
Protein, ur: NEGATIVE mg/dL
Specific Gravity, Urine: 1.025 (ref 1.005–1.030)
pH: 5 (ref 5.0–8.0)

## 2023-05-03 LAB — POC URINE PREG, ED: Preg Test, Ur: NEGATIVE

## 2023-05-03 MED ORDER — TRANEXAMIC ACID 650 MG PO TABS: 1300.0000 mg | ORAL_TABLET | Freq: Three times a day (TID) | ORAL | 0 refills | Status: AC

## 2023-05-03 NOTE — ED Provider Notes (Signed)
[facility] Provider Note    Event Date/Time   First MD Initiated Contact with Patient 05/03/23 2131     (approximate)  History   Chief Complaint: Vaginal Bleeding  HPI  Sue Hall is a 37 y.o. female with a past medical history of hypertension who presents to the emergency department for vaginal bleeding.  According to the patient she has a history of heavy vaginal bleeding.  States over the last 2 months it has been worse than typical states last month she bled for 2 to 3 weeks heavily.  This point she bled very lightly last week and more heavily this week with small clots.  Patient states she was feeling somewhat weak and having lower abdominal cramping.  States she has had 2 family members who have presented similarly with ovarian cysts and one required surgery for a pelvic mass.  Patient was concerned so she came to the emergency department for evaluation.  Patient states she tried to call an OB/GYN but no one had appointments for many months which again prompted her ER visit.  States mild lower abdominal cramping but no significant pain.  Physical Exam   Triage Vital Signs: ED Triage Vitals  Encounter Vitals Group     BP 05/03/23 2012 (!) 141/96     Systolic BP Percentile --      Diastolic BP Percentile --      Pulse Rate 05/03/23 2012 91     Resp 05/03/23 2012 18     Temp 05/03/23 2012 98.6 F (37 C)     Temp Source 05/03/23 2012 Oral     SpO2 05/03/23 2012 100 %     Weight 05/03/23 2011 190 lb (86.2 kg)     Height 05/03/23 2011 5\' 3"  (1.6 m)     Head Circumference --      Peak Flow --      Pain Score 05/03/23 2013 7     Pain Loc --      Pain Education --      Exclude from Growth Chart --     Most recent vital signs: Vitals:   05/03/23 2012  BP: (!) 141/96  Pulse: 91  Resp: 18  Temp: 98.6 F (37 C)  SpO2: 100%    General: Awake, no distress.  CV:  Good peripheral perfusion.  Regular rate and rhythm  Resp:  Normal effort.   Equal breath sounds bilaterally.  Abd:  No distention.  Soft, mild lower abdominal tenderness to palpation without rebound or guarding  ED Results / Procedures / Treatments   RADIOLOGY  Ultrasound pending   MEDICATIONS ORDERED IN ED: Medications - No data to display   IMPRESSION / MDM / ASSESSMENT AND PLAN / ED COURSE  I reviewed the triage vital signs and the nursing notes.  Patient's presentation is most consistent with acute presentation with potential threat to life or bodily function.  Patient presents emergency department for lower abdominal pain/cramping and vaginal bleeding.  Patient states a history of heavy vaginal bleeding.  Reassuringly CBC is largely within normal notes with hemoglobin 11.1.  Chemistry reassuring urinalysis is normal besides mild amount of blood.  Pregnancy test is negative.  Given the patient's heavy vaginal bleeding we will obtain a pelvic ultrasound to further evaluate.  Patient is very hesitant to go on hormonal treatment.  Will obtain the ultrasound and discussed possible medications such as TXA versus OCPs until the patient to follow-up with OB/GYN.  I have placed  a referral to Dr. Dalbert Garnet.  Ultrasound is pending.  Lab work reassuring.  Patient care signed out to oncoming provider.  I have called the patient and 3 days of TXA as well as given the patient Dr. Francena Hanly follow-up information.  FINAL CLINICAL IMPRESSION(S) / ED DIAGNOSES   Dysfunctional uterine bleeding   Note:  This document was prepared using Dragon voice recognition software and may include unintentional dictation errors.   Minna Antis, MD 05/03/23 2314

## 2023-05-03 NOTE — Discharge Instructions (Addendum)
Take your medication as prescribed for the next 3 days.  Please call the number provided for OB/GYN to arrange a follow-up appointment for further evaluation.  Return to the emergency department for any significant increase in bleeding abdominal pain or any other symptom personally concerning to yourself.

## 2023-05-03 NOTE — ED Triage Notes (Signed)
Pt presents ambulatory to triage via POV with complaints of abnormal vaginal bleeding. Pt notes having menstrual bleeding every 1.5-2 weeks with heavy bleeding and clots. Hx of uterine fibroids. A&Ox4 at this time. Denies dizziness, CP or SOB.

## 2023-05-04 NOTE — ED Provider Notes (Signed)
Care of this patient assumed from prior physician at 2300 pending ultrasound, reevaluation, and disposition. Please see prior physician note for further details.  Briefly this is a 37 year old female with history of uterine fibroids who presented with heavy vaginal bleeding.  Vital signs stable.  Blood work with mild anemia with hemoglobin 11.1.  Signed out to me pending ultrasound for further evaluation.  Pelvic ultrasound demonstrated multiple large uterine fibroids.  It was also notable for a large, complex left ovarian cyst, per radiology concerning for possible neoplastic process.  A pelvic exam was performed demonstrating some blood in the vaginal vault, but this was able to be cleared and the cervix was visualized.  No adnexal or cervical motion tenderness.  Case was reviewed with Dr. Feliberto Gottron in the setting of abnormal ultrasound findings.  He did not feel that MRI or further testing was necessary from the ER.  He did report that he could arrange outpatient follow-up in clinic for the patient.  Discussed with patient who is comfortable with this plan.  She does not currently wish to go on any contraceptive medication.  Prescription for TXA sent by prior physician for management of her vaginal bleeding. Strict return precautions provided.  Patient discharged in stable condition.    Trinna Post, MD 05/04/23 [phone]

## 2023-08-26 ENCOUNTER — Ambulatory Visit: Payer: Medicaid Other

## 2023-11-04 ENCOUNTER — Ambulatory Visit: Payer: Medicaid Other

## 2023-12-24 ENCOUNTER — Ambulatory Visit

## 2023-12-24 ENCOUNTER — Encounter: Payer: Self-pay | Admitting: Nurse Practitioner

## 2023-12-24 DIAGNOSIS — Z113 Encounter for screening for infections with a predominantly sexual mode of transmission: Secondary | ICD-10-CM

## 2023-12-24 LAB — WET PREP FOR TRICH, YEAST, CLUE
Trichomonas Exam: NEGATIVE
Yeast Exam: NEGATIVE

## 2023-12-24 LAB — HEPATITIS B SURFACE ANTIGEN

## 2023-12-24 LAB — HM HEPATITIS C SCREENING LAB: HM Hepatitis Screen: NEGATIVE

## 2023-12-24 LAB — HM HIV SCREENING LAB: HM HIV Screening: NEGATIVE

## 2023-12-24 NOTE — Progress Notes (Signed)
 Pt is here for STD screening , Wet prep results reviewed with pt, no treatment required per standing order. Condoms Given. Sonda Primes, RN.

## 2024-01-05 NOTE — Progress Notes (Signed)
 [facility] Department STI clinic [address], [address] [place] [address] Main phone: [phone]  STI screening visit  Subjective:  Sue Hall is a 38 y.o. female being seen today for an STI screening visit. The patient reports they do have symptoms.  Patient reports that they do not desire a pregnancy in the next year.   They reported they are not interested in discussing contraception today.    Patient's last menstrual period was 11/23/2023 (approximate).  Patient has the following medical conditions:  Patient Active Problem List   Diagnosis Date Noted   Bartholin's gland abscess 01/23/2020   Hypertension 06/30/2019   Pelvic inflammatory disease 02/06/2018   Uterine fibroid 05/07/2015   History of ectopic pregnancy 11/01/2014    Chief Complaint  Patient presents with   SEXUALLY TRANSMITTED DISEASE   Patient is a pleasant 38 y.o. female who presents to the office today requesting symptomatic STI testing.  Patient reports vaginal itching that began 4-5 days ago that has not subsided with OTC treatment. She also reports dysuria for the last 1 to 2 days. She denies fever and back pain. She reports nausea but attributes that to eating late at night.  Patient indicates 2 female/female partners in the last 2 months. She reports practicing vaginal sex and uses condoms sometimes. Patient indicates a history of gonorrhea, trich, and PID. Patient reports last sex was 14 days ago. She indicates no use of contraception method.  Patient indicates LMP was 11/23/23 and has periods monthly.     Last HIV test per patient/review of record was  Lab Results  Component Value Date   HMHIVSCREEN Negative - Validated 12/24/2023    Lab Results  Component Value Date   HIV Non Reactive 02/07/2018     Last HEPC test per patient/review of record was  Lab Results  Component Value Date   HMHEPCSCREEN Negative-Validated 12/24/2023   No components found for:  "HEPC"   Last HEPB test per patient/review of record was No components found for: "HMHEPBSCREEN"   Patient reports last pap was:  Lab Results  Component Value Date   SPECADGYN Comment 01/23/2020   Result Date Procedure Results Follow-ups  01/23/2020 IGP, Aptima HPV DIAGNOSIS:: Comment Specimen adequacy:: Comment Clinician Provided ICD10: Comment Performed by:: Comment PAP Smear Comment: . Note:: Comment Test Methodology: Comment HPV Aptima: Negative   05/07/2015 HM PAP SMEAR HM Pap smear: Negative     Screening for MPX risk: Does the patient have an unexplained rash? No Is the patient MSM? No Does the patient endorse multiple sex partners or anonymous sex partners? No Did the patient have close or sexual contact with a person diagnosed with MPX? No Has the patient traveled outside the [place] where MPX is endemic? No Is there a high clinical suspicion for MPX-- evidenced by one of the following No  -Unlikely to be chickenpox  -Lymphadenopathy  -Rash that present in same phase of evolution on any given body part See flowsheet for further details and programmatic requirements.   Immunization history:  Immunization History  Administered Date(s) Administered   Hepatitis B 07/08/1998, 08/05/1998, 01/06/1999   MMR 04/23/1989, 05/13/1992   Td 01/23/2020   Tdap 11/09/2006     The following portions of the patient's history were reviewed and updated as appropriate: allergies, current medications, past medical history, past social history, past surgical history and problem list.  Objective:  There were no vitals filed for this visit.  Physical Exam Nursing note reviewed.  Constitutional:  Appearance: Normal appearance.  HENT:     Head: Normocephalic.     Salivary Glands: Right salivary gland is not diffusely enlarged or tender. Left salivary gland is not diffusely enlarged or tender.     Mouth/Throat:     Lips: Pink. No lesions.     Mouth: Mucous membranes are moist.      Tongue: No lesions. Tongue does not deviate from midline.     Pharynx: Oropharynx is clear. Uvula midline.     Tonsils: No tonsillar exudate.  Eyes:     General:        Right eye: No discharge.        Left eye: No discharge.  Pulmonary:     Effort: Pulmonary effort is normal.  Genitourinary:    General: Normal vulva.     Exam position: Lithotomy position.     Pubic Area: No rash or pubic lice.      Tanner stage (genital): 5.     Labia:        Right: No rash, tenderness, lesion or injury.        Left: No rash, tenderness, lesion or injury.      Vagina: Normal. No vaginal discharge, erythema, tenderness, bleeding or lesions.     Cervix: Normal. No cervical motion tenderness, discharge, friability, lesion, erythema, cervical bleeding or eversion.     Uterus: Normal.      Adnexa: Right adnexa normal and left adnexa normal.     Comments: pH<4.5 Lymphadenopathy:     Head:     Right side of head: No submental, submandibular, tonsillar, preauricular or posterior auricular adenopathy.     Left side of head: No submental, submandibular, tonsillar, preauricular or posterior auricular adenopathy.     Cervical: No cervical adenopathy.     Right cervical: No superficial or posterior cervical adenopathy.    Left cervical: No superficial or posterior cervical adenopathy.     Upper Body:     Right upper body: No supraclavicular or axillary adenopathy.     Left upper body: No supraclavicular or axillary adenopathy.     Lower Body: No right inguinal adenopathy. No left inguinal adenopathy.  Skin:    General: Skin is warm and dry.     Findings: No rash.     Comments: Skin tone appropriate for ethnicity.   Neurological:     Mental Status: She is alert and oriented to person, place, and time.  Psychiatric:        Attention and Perception: Attention and perception normal.        Mood and Affect: Mood and affect normal.        Speech: Speech normal.        Behavior: Behavior normal. Behavior is  cooperative.        Thought Content: Thought content normal.     Assessment and Plan:  [name] is a 38 y.o. female presenting to the [facility] Department for STI screening  1. Screening for venereal disease (Primary) Wet prep negative in office today and PE unremarkable.  Advised patient if urinary symptoms continue, get worse, or are accompanied by other symptoms including fever and back pain she should be evaluated for possible UTI.  Patient verbalized understanding and agreed with plan.   - Chlamydia/Gonorrhea [place] - HBV Antigen/Antibody [facility] - HIV/HCV [facility] - WET PREP FOR TRICH, YEAST, CLUE - Syphilis Serology,  Lab  Patient accepted screenings including vaginal CT/GC and bloodwork  for HIV/RPR, and wet prep. Patient meets criteria for HepB screening? Yes. Ordered? yes Patient meets criteria for HepC screening? Yes. Ordered? yes  Treat wet prep per standing order Discussed time line for State [facility] results and that patient will be called with positive results and encouraged patient to call if she had not heard in 2 weeks.  Counseled to return or seek care for continued or worsening symptoms Recommended repeat testing in 3 months with positive results. Recommended condom use with all sex for STI prevention.   Patient is currently using  no method  to prevent pregnancy.    Return if symptoms worsen or fail to improve.  No future appointments.  Total time with patient 30 minutes.   Edmonia James, NP

## 2024-03-01 ENCOUNTER — Encounter: Payer: Self-pay | Admitting: Emergency Medicine

## 2024-03-01 ENCOUNTER — Other Ambulatory Visit: Payer: Self-pay

## 2024-03-01 ENCOUNTER — Emergency Department
Admission: EM | Admit: 2024-03-01 | Discharge: 2024-03-01 | Disposition: A | Discharge status: Home / Self Care | Attending: Emergency Medicine | Admitting: Emergency Medicine

## 2024-03-01 DIAGNOSIS — I10 Essential (primary) hypertension: Secondary | ICD-10-CM | POA: Diagnosis not present

## 2024-03-01 DIAGNOSIS — R509 Fever, unspecified: Secondary | ICD-10-CM | POA: Diagnosis present

## 2024-03-01 DIAGNOSIS — J069 Acute upper respiratory infection, unspecified: Secondary | ICD-10-CM | POA: Diagnosis not present

## 2024-03-01 LAB — GROUP A STREP BY PCR: Group A Strep by PCR: NOT DETECTED

## 2024-03-01 LAB — RESP PANEL BY RT-PCR (RSV, FLU A&B, COVID)  RVPGX2
Influenza A by PCR: NEGATIVE
Influenza B by PCR: NEGATIVE
Resp Syncytial Virus by PCR: NEGATIVE
SARS Coronavirus 2 by RT PCR: NEGATIVE

## 2024-03-01 MED ORDER — HYDROCHLOROTHIAZIDE 12.5 MG PO CAPS: 12.5000 mg | ORAL_CAPSULE | Freq: Every day | ORAL | 3 refills | Status: AC

## 2024-03-01 MED ORDER — DEXAMETHASONE SODIUM PHOSPHATE 10 MG/ML IJ SOLN
10.0000 mg | Freq: Once | INTRAMUSCULAR | Status: AC
Start: 2024-03-01 — End: 2024-03-01
  Administered 2024-03-01: 10 mg via INTRAMUSCULAR
  Filled 2024-03-01: qty 1

## 2024-03-01 NOTE — ED Triage Notes (Signed)
 Patient ambulatory to triage with steady gait, without difficulty or distress noted; pt reports x 2 days having sore throat, nasal congestion, HA and fever

## 2024-03-01 NOTE — ED Provider Notes (Signed)
 [facility] Provider Note    Event Date/Time   First MD Initiated Contact with Patient 03/01/24 2113     (approximate)   History   Nasal Congestion   HPI  Sue Hall is a 38 y.o. female with history of PID, hypertension and as listed in EMR presents to the emergency department for treatment and evaluation of 2 days of sore throat, nasal congestion, headache and fever.  No relief with over-the-counter medications.      Physical Exam   Triage Vital Signs: ED Triage Vitals  Encounter Vitals Group     BP 03/01/24 2021 (!) 172/114     Systolic BP Percentile --      Diastolic BP Percentile --      Pulse Rate 03/01/24 2021 (!) 101     Resp 03/01/24 2021 19     Temp 03/01/24 2021 98.7 F (37.1 C)     Temp Source 03/01/24 2021 Oral     SpO2 03/01/24 2021 100 %     Weight 03/01/24 2019 185 lb (83.9 kg)     Height 03/01/24 2019 5\' 3"  (1.6 m)     Head Circumference --      Peak Flow --      Pain Score 03/01/24 2019 7     Pain Loc --      Pain Education --      Exclude from Growth Chart --     Most recent vital signs: Vitals:   03/01/24 2021  BP: (!) 172/114  Pulse: (!) 101  Resp: 19  Temp: 98.7 F (37.1 C)  SpO2: 100%    General: Awake, no distress.  CV:  Good peripheral perfusion.  Resp:  Normal effort.  Breath sounds clear to auscultation Abd:  No distention.  Other:  Posterior oropharynx mildly erythematous without tonsillar exudate   ED Results / Procedures / Treatments   Labs (all labs ordered are listed, but only abnormal results are displayed) Labs Reviewed  GROUP A STREP BY PCR  RESP PANEL BY RT-PCR (RSV, FLU A&B, COVID)  [id number]     EKG  Not indicated   RADIOLOGY  Image and radiology report reviewed and interpreted by me. Radiology report consistent with the same.  Not indicated  PROCEDURES:  Critical Care performed: No  Procedures   MEDICATIONS ORDERED IN ED:  Medications  dexamethasone   (DECADRON ) injection 10 mg (has no administration in time range)     IMPRESSION / MDM / ASSESSMENT AND PLAN / ED COURSE   I have reviewed the triage note.  Differential diagnosis includes, but is not limited to, viral URI, bronchitis, COVID, influenza, strep throat  Patient's presentation is most consistent with acute illness / injury with system symptoms.  38 year old female presenting to the emergency department for treatment and evaluation of sore throat, watery eyes, nasal congestion, and occasional cough for the past 2 days. See HPI.  Vital signs show that she is hypertensive at 172/114 and very slightly tachycardic at 101.  Normal respiratory rate.  Afebrile and SpO2 100% on room air.  On exam, she does have watery discharge from both eyes and posterior oropharynx is erythematous.  Breath sounds are normal.  Respiratory panel and group A strep screen were obtained while awaiting ER room assignment and are all negative.  Results discussed with the patient.  Hypertension discussed with the patient who reports that she has been out of her blood pressure medication for several months.  She has been trying  to get an appointment with primary care but they are unable to see her for several months because she is a new patient.  She is asymptomatic regarding her hypertension and states that she has had blood pressure issues since she was in high school.  She will be given a 63-month supply of hydrochlorothiazide  which she reports has worked well for her in the past.  For her URI symptoms, she will be given a shot of Decadron  while here and encouraged to take Mucinex.  She was advised of the risk of most over-the-counter cough and cold medications with her history of hypertension. ER return precautions discussed.      FINAL CLINICAL IMPRESSION(S) / ED DIAGNOSES   Final diagnoses:  Acute URI  Uncontrolled hypertension     Rx / DC Orders   ED Discharge Orders          Ordered     hydrochlorothiazide  (MICROZIDE ) 12.5 MG capsule  Daily        03/01/24 2130             Note:  This document was prepared using Dragon voice recognition software and may include unintentional dictation errors.   Sherryle Don, FNP 03/01/24 2137    Viviano Ground, MD 03/06/24 [phone]

## 2024-06-06 ENCOUNTER — Ambulatory Visit

## 2024-10-10 ENCOUNTER — Ambulatory Visit

## 2024-10-10 DIAGNOSIS — Z202 Contact with and (suspected) exposure to infections with a predominantly sexual mode of transmission: Secondary | ICD-10-CM

## 2024-10-10 DIAGNOSIS — Z113 Encounter for screening for infections with a predominantly sexual mode of transmission: Secondary | ICD-10-CM

## 2024-10-10 DIAGNOSIS — R4586 Emotional lability: Secondary | ICD-10-CM

## 2024-10-10 LAB — WET PREP FOR TRICH, YEAST, CLUE
Clue Cell Exam: NEGATIVE
Trichomonas Exam: NEGATIVE
Yeast Exam: NEGATIVE

## 2024-10-10 LAB — HM HIV SCREENING LAB: HM HIV Screening: NEGATIVE

## 2024-10-10 MED ORDER — DOXYCYCLINE HYCLATE 100 MG PO TABS: 100.0000 mg | ORAL_TABLET | Freq: Two times a day (BID) | ORAL | Status: AC

## 2024-10-10 MED ORDER — CEFTRIAXONE SODIUM 500 MG IJ SOLR
500.0000 mg | Freq: Once | INTRAMUSCULAR | Status: AC
  Administered 2024-10-10: 500 mg via INTRAMUSCULAR

## 2024-10-10 NOTE — Progress Notes (Signed)
 " [facility] Department STI clinic [address] [place] [address] Main phone: [phone]  STI screening visit  Subjective:  Sue Hall is a 39 y.o. female being seen today for an STI screening visit. The patient reports they do not have symptoms.    Patient has the following medical conditions:  Patient Active Problem List   Diagnosis Date Noted   Bartholin's gland abscess 01/23/2020   Hypertension 06/30/2019   Pelvic inflammatory disease 02/06/2018   Uterine fibroid 05/07/2015   History of ectopic pregnancy 11/01/2014   Chief Complaint  Patient presents with   Exposure to STD    Pt is here for STD screening, report contact to Chlamydia/Gonorrhea   HPI Patient reports exposure to both gonorrhea and chlamydia. Recent exposure was a few days after Christmas, and she was informed a few days ago. Has noticed a bit of discomfort in genital area.  Reports current smoker, but not ready to quit at this time. Currently struggling with anxiety and depression.  Reproductive considerations Patient reports they are not pregnant . They do not desire a pregnancy in the next year. Patient is currently using female condom to prevent pregnancy. They reported they are not interested in discussing contraception today.    Patient's last menstrual period was 10/01/2024.  Does the patient using douching products? No  Patient's routine cervical screening was done recently at primary ob/gyn.   See flowsheet for further details and programmatic requirements Hyperlink available at the top of the signed note in blue.  Flow sheet content below:  Pregnancy Intention Screening Does the patient want to become pregnant in the next year?: No Does the patient's partner want to become pregnant in the next year?: No Would the patient like to discuss contraceptive options today?: No Reason For STD Screen STD Screening: Has symptoms Have you ever had an STD?:  Yes History of Antibiotic use in the past 2 weeks?: No STD Symptoms Genital Itching: No Lower abdominal pain: No Discharge: No Dysuria: No Genital ulcer / lesion: No Rash: No Vaginal irritation: Yes Oral / Other skin ulcer: No Pain with sex: No Sore Throat: No Visual Changes: No Vaginal Bleeding: No Risk Factors for Hep B Household, sexual, or needle sharing contact of a person infected with Hep B: No Sexual contact with a person who uses drugs not as prescribed?: No Currently or Ever used drugs not as prescribed: No HIV Positive: No PRep Patient: No Men who have sex with men: No Have Hepatitis C: No History of Incarceration: No History of Homeslessness?: No Anal sex following anal drug use?: No Risk Factors for Hep C Currently using drugs not as prescribed: No Sexual partner(s) currently using drugs as not prescribed: No History of drug use: No HIV Positive: No People with a history of incarceration: No People born between the years of 63 and 70: No Advise Advised client to quit or stay quit. : Yes Abuse History Has patient ever been abused physically?: No Has patient ever been abused sexually?: No Does patient feel they have a problem with Anxiety?: Yes Does patient feel they have a problem with Depression?: Yes Counseling Patient counseled to use condoms with all sex: Condoms given Test results given to patient Patient counseled to use condoms with all sex: Condoms given   Screening for MPX risk:  Unexplained rash?  No   MSM?  No   Multiple or anonymous sex partners?  No   Any close or sexual contact with  a person  diagnosed with MPX?  No   Any outside the [place]  where MPX is endemic?  No   High clinical suspicion for MPX?    -Unlikely to be chickenpox    -Lymphadenopathy    -Rash that presents in same phase of       evolution on any given body part  No   Does this patient meet CDC recommendations for vaccination against MPOX? No  You already have or  anticipate having the following risks:  Your sex partner has the following risks: You're traveling to a county with a clade I MPOX outbreak and anticipate these risks: Occupational exposure  You had known or suspected exposure to someone with monkeypox You had a sex partner in the past 2 weeks who was diagnosed with monkeypox You are a gay, bisexual, or other man who has sex with men, or are transgender or nonbinary and in the past 6 months have had any of the following: - A new diagnosis of one or more sexually transmitted diseases (e.g., chlamydia, gonorrhea, or syphilis) - More than one sex partner You have had any of the following in the past 6 months: - Sex at a commercial sex venue (like a sex club or bathhouse) - Sex related to a large commercial event   or in a geographic area (city or county for example) where mpox virus transmission is occurring Sex with a new partner Sex at a commercial sex venue (e.g., a sex club or bathhouse) Sex in it consultant for money, goods, drugs, or other trade Sex in association with a large public event (e.g., a rave, party, or festival) i.e. certain people who work in a laboratory or healthcare facility   Infectious disease screenings: Vaccinated against HPV? Unknown  HIV Ever had a positive? No Last test: 2025 Results in chart:  Lab Results  Component Value Date   HMHIVSCREEN Negative - Validated 12/24/2023    Lab Results  Component Value Date   HIV Non Reactive 02/07/2018   Hep B Hep B status: negative on 2025 Received HBV vaccination? Yes Received HBV testing for immunity? Yes Results in chart:  No components found for: HMHEPBSCREEN  Do they qualify for HBV screening today? No Criteria:  -Household, sexual or needle sharing contact with HBV -History of drug use or homelessness -HIV positive -Those with known Hep C  Hep C Hep C status: negative on 2025 Results in chart:  Lab Results  Component Value Date   HMHEPCSCREEN  Negative-Validated 12/24/2023   No components found for: HEPC  Do they qualify for HCV screening today? No Criteria - since the last HCV result, does the patient have any of the following? - Current drug use - Have a partner with drug use - Has been incarcerated  Immunization history:  Immunization History  Administered Date(s) Administered   Hepatitis B 07/08/1998, 08/05/1998, 01/06/1999   MMR 04/23/1989, 05/13/1992   Td 01/23/2020   Tdap 11/09/2006    The following portions of the patient's history were reviewed and updated as appropriate: allergies, current medications, past medical history, past social history, past surgical history and problem list.  Substance use screenings:  Uses tobacco products? Yes, not ready to quit Uses vapes? No Uses non-injectable substances that alter your mental status? No Uses non-prescribed injectable substances? No  Objective:  There were no vitals filed for this visit.  Physical Exam Vitals and nursing note reviewed. Exam conducted with a chaperone present [place]).  Constitutional:  Appearance: Normal appearance.  HENT:     Head: Normocephalic and atraumatic.     Comments: No nits or hair loss of scalp, brows, and lashes    Mouth/Throat:     Lips: Pink. No lesions.     Mouth: Mucous membranes are moist.     Pharynx: Oropharynx is clear. Posterior oropharyngeal erythema present. No oropharyngeal exudate.  Eyes:     General:        Right eye: No discharge.        Left eye: No discharge.     Conjunctiva/sclera: Conjunctivae normal.     Right eye: Right conjunctiva is not injected.     Left eye: Left conjunctiva is not injected.  Pulmonary:     Effort: Pulmonary effort is normal.  Genitourinary:    General: Normal vulva.     Exam position: Lithotomy position.     Pubic Area: No rash or pubic lice.      Labia:        Right: No rash or lesion.        Left: No rash or lesion.      Vagina: Normal. No vaginal discharge,  erythema or lesions.     Cervix: No cervical motion tenderness, discharge, lesion or erythema.     Comments: pH = <4.5 Lymphadenopathy:     Cervical: No cervical adenopathy.     Upper Body:     Right upper body: No supraclavicular adenopathy.     Left upper body: No supraclavicular adenopathy.  Skin:    General: Skin is warm and dry.     Findings: No lesion or rash.  Neurological:     Mental Status: She is alert and oriented to person, place, and time.    Assessment and Plan:  Sue Hall is a 39 y.o. female presenting to the [facility] Department for STI screening.  Patient accepted the following screenings: oral GC culture, vaginal CT/GC swab, vaginal wet prep, HIV, and RPR  1. Screening for venereal disease (Primary)  - WET PREP FOR TRICH, YEAST, CLUE - Chlamydia/Gonorrhea [place] [facility] - HIV [place] [facility] - Syphilis Serology, [place] [facility] - Gonococcus culture  2. Exposure to chlamydia  - doxycycline  (VIBRA -TABS) 100 MG tablet; Take 1 tablet (100 mg total) by mouth 2 (two) times daily for 7 days.  3. Exposure to gonorrhea  - cefTRIAXone  (ROCEPHIN ) injection 500 mg   4. Mood changes  - Reports anxiety, depression, tearful at visit - Discussed counseling availability at [facility], [facility] - [facility] LCSW card provided  Counseling: Discussed time line for [facility] results and that patient will be called with positive results and encouraged patient to call if they had not heard in 2 weeks.  Counseled to return or seek care for continued or worsening symptoms Recommended repeat testing in 3 months with positive results. Recommended condom use with all sex for STI prevention.   Return in about 3 months (around 01/08/2025).  No future appointments.  Sue FORBES Satchel, NP "

## 2024-10-10 NOTE — Progress Notes (Signed)
 Pt is here for STD screening and a contact to Chlamydia and Gonorrhea. Wet prep results reviewed with pt and requires no treatment. The patient was dispensed doxycyline 100 mg capsuless 2x/day for 7 days. Ceftriaxone  500 mg IM injection given at the [facility]  and patient tolerated well to injection. I provided counseling today regarding the medication, the side effects and when to call clinic. Patient was given the opportunity to ask questions for any clarifications. Questions answered. Brochure and condoms given. Wilkie Drought, RN.

## 2024-10-15 LAB — GONOCOCCUS CULTURE

## 2024-10-16 ENCOUNTER — Telehealth: Payer: Self-pay | Admitting: Family Medicine
